# Patient Record
Sex: Female | Born: 1960 | Race: Black or African American | Hispanic: No | Marital: Single | Smoking: Current every day smoker
Health system: Southern US, Community
[De-identification: ages and names within clinical notes are randomized; demographics above are authoritative.]

## PROBLEM LIST (undated history)

## (undated) DIAGNOSIS — F209 Schizophrenia, unspecified: Secondary | ICD-10-CM

## (undated) DIAGNOSIS — R7303 Prediabetes: Secondary | ICD-10-CM

## (undated) HISTORY — PX: OTHER SURGICAL HISTORY: SHX169

---

## 2018-07-01 ENCOUNTER — Other Ambulatory Visit: Payer: Self-pay

## 2018-07-01 ENCOUNTER — Emergency Department
Admission: EM | Admit: 2018-07-01 | Discharge: 2018-07-02 | Disposition: A | Discharge status: Other Acute Inpatient Hospital | Payer: Medicare Other | Attending: Emergency Medicine | Admitting: Emergency Medicine

## 2018-07-01 DIAGNOSIS — F1410 Cocaine abuse, uncomplicated: Secondary | ICD-10-CM

## 2018-07-01 DIAGNOSIS — R443 Hallucinations, unspecified: Secondary | ICD-10-CM

## 2018-07-01 DIAGNOSIS — Z046 Encounter for general psychiatric examination, requested by authority: Secondary | ICD-10-CM | POA: Insufficient documentation

## 2018-07-01 DIAGNOSIS — F259 Schizoaffective disorder, unspecified: Secondary | ICD-10-CM

## 2018-07-01 DIAGNOSIS — Z9114 Patient's other noncompliance with medication regimen: Secondary | ICD-10-CM | POA: Insufficient documentation

## 2018-07-01 DIAGNOSIS — F17200 Nicotine dependence, unspecified, uncomplicated: Secondary | ICD-10-CM | POA: Insufficient documentation

## 2018-07-01 DIAGNOSIS — R441 Visual hallucinations: Secondary | ICD-10-CM | POA: Insufficient documentation

## 2018-07-01 DIAGNOSIS — F329 Major depressive disorder, single episode, unspecified: Secondary | ICD-10-CM | POA: Insufficient documentation

## 2018-07-01 DIAGNOSIS — R440 Auditory hallucinations: Secondary | ICD-10-CM | POA: Insufficient documentation

## 2018-07-01 HISTORY — DX: Prediabetes: R73.03

## 2018-07-01 LAB — COMPREHENSIVE METABOLIC PANEL
ALT: 14 U/L (ref 0–44)
AST: 22 U/L (ref 15–41)
Albumin: 3.8 g/dL (ref 3.5–5.0)
Alkaline Phosphatase: 82 U/L (ref 38–126)
Anion gap: 11 (ref 5–15)
BUN: 13 mg/dL (ref 6–20)
CALCIUM: 8.9 mg/dL (ref 8.9–10.3)
CHLORIDE: 105 mmol/L (ref 98–111)
CO2: 23 mmol/L (ref 22–32)
CREATININE: 1.01 mg/dL — AB (ref 0.44–1.00)
GFR calc Af Amer: >60 mL/min (ref >60)
GFR calc non Af Amer: >60 mL/min (ref >60)
Glucose, Bld: 113 mg/dL — ABNORMAL HIGH (ref 70–99)
Potassium: 4.1 mmol/L (ref 3.5–5.1)
Sodium: 139 mmol/L (ref 135–145)
Total Bilirubin: 0.4 mg/dL (ref 0.3–1.2)
Total Protein: 7.1 g/dL (ref 6.5–8.1)

## 2018-07-01 LAB — ETHANOL: Alcohol, Ethyl (B): <10 mg/dL (ref <10)

## 2018-07-01 LAB — CBC
HCT: 43.3 % (ref 36.0–46.0)
Hemoglobin: 13.8 g/dL (ref 12.0–15.0)
MCH: 27 pg (ref 26.0–34.0)
MCHC: 31.9 g/dL (ref 30.0–36.0)
MCV: 84.6 fL (ref 80.0–100.0)
NRBC: 0 % (ref 0.0–0.2)
PLATELETS: 222 10*3/uL (ref 150–400)
RBC: 5.12 MIL/uL — ABNORMAL HIGH (ref 3.87–5.11)
RDW: 14.4 % (ref 11.5–15.5)
WBC: 11.4 10*3/uL — ABNORMAL HIGH (ref 4.0–10.5)

## 2018-07-01 LAB — ACETAMINOPHEN LEVEL: Acetaminophen (Tylenol), Serum: <10 ug/mL — ABNORMAL LOW (ref 10–30)

## 2018-07-01 LAB — SALICYLATE LEVEL

## 2018-07-01 NOTE — ED Notes (Signed)
Pt. Transferred to BHU from ED to room 3 after screening for contraband. Report to include Situation, Background, Assessment and Recommendations from Vanessa DurhamKim Summers RN. Pt. Oriented to unit including Q15 minute rounds as well as the security cameras for their protection. Patient is alert and oriented, warm and dry in no acute distress. Patient denies SI, HI. Patient states she hears voices without command and see "lots of stuff". Pt. Encouraged to let me know if needs arise.

## 2018-07-01 NOTE — ED Notes (Signed)
Patient alert and oriented. Patient states she is hearing voices and seeing hallucinations. Pt states she has not been taking her medications for a few days and has did some cocaine and drank beer and made the hallucination worse. Patient contracts for safety with this Clinical research associatewriter.

## 2018-07-01 NOTE — ED Triage Notes (Signed)
Pt to the er because she is hearing voices and she is scared and worried. Pt says she took a bus from [place]. Pt states the voices are telling her to kill herself

## 2018-07-01 NOTE — ED Notes (Signed)
Hourly rounding reveals patient sleeping in room. No complaints, stable, in no acute distress. Q15 minute rounds and monitoring via Security Cameras to continue. 

## 2018-07-01 NOTE — ED Provider Notes (Signed)
[facility] EMERGENCY DEPARTMENT Provider Note   CSN: [id number] Arrival date & time: 07/01/18  1927     History   Chief Complaint No chief complaint on file.   HPI Nicole Cuevas is a 57 y.o. female history of schizophrenia, diabetes, chronic pain here presenting with hallucinations.  Patient states that she has worsening hallucination for the last several days.  Patient states that she has auditory and visual hallucinations.  She sees a knife cutting her hair.  She also hears voices and telling her to kill herself.  As a result she left the room where she is from and she did drink some cocaine and beer and the hallucinations became worse.  Patient states that she did not want to take her medicines for the last 2 days including her metformin psych meds.  [facility].   The history is provided by the patient.    Past Medical History:  Diagnosis Date  . Pre-diabetes     There are no active problems to display for this patient.     OB History   None      Home Medications    Prior to Admission medications   Not on File    Family History No family history on file.  Social History Social History   Tobacco Use  . Smoking status: Current Every Day Smoker    Packs/day: 1.00    Years: 40.00    Pack years: 40.00  . Smokeless tobacco: Never Used  Substance Use Topics  . Alcohol use: Yes    Alcohol/week: 1.0 standard drinks    Types: 1 Cans of beer per week  . Drug use: Yes    Types: Cocaine     Allergies   Patient has no allergy information on record.   Review of Systems Review of Systems  Psychiatric/Behavioral: Positive for hallucinations.  All other systems reviewed and are negative.    Physical Exam Updated Vital Signs BP 103/70 (BP Location: Left Arm)   Pulse 93   Temp 98.6 F (37 C) (Oral)   Resp 18   Ht 5\' 3"  (1.6 m)   Wt 103.4 kg   SpO2 96%   BMI 40.39 kg/m   Physical Exam    Constitutional: She is oriented to person, place, and time.  Hallucinating, responding to internal stimuli   HENT:  Head: Normocephalic.  Mouth/Throat: Oropharynx is clear and moist.  Eyes: Pupils are equal, round, and reactive to light. Conjunctivae and EOM are normal.  Neck: Normal range of motion. Neck supple.  Cardiovascular: Normal rate, regular rhythm and normal heart sounds.  Pulmonary/Chest: Effort normal and breath sounds normal. No stridor. No respiratory distress.  Abdominal: Soft. Bowel sounds are normal. She exhibits no distension. There is no tenderness.  Musculoskeletal: Normal range of motion.  Neurological: She is alert and oriented to person, place, and time.  Skin: Skin is warm.  Psychiatric:  Responding to internal stimuli. Depressed   Nursing note and vitals reviewed.    ED Treatments / Results  Labs (all labs ordered are listed, but only abnormal results are displayed) Labs Reviewed  COMPREHENSIVE METABOLIC PANEL - Abnormal; Notable for the following components:      Result Value   Glucose, Bld 113 (*)    Creatinine, Ser 1.01 (*)    All other components within normal limits  ACETAMINOPHEN LEVEL - Abnormal; Notable for the following components:   Acetaminophen (Tylenol), Serum <10 (*)    All  other components within normal limits  CBC - Abnormal; Notable for the following components:   WBC 11.4 (*)    RBC 5.12 (*)    All other components within normal limits  ETHANOL  SALICYLATE LEVEL  URINE DRUG SCREEN, QUALITATIVE ([facility] ONLY)  POC URINE PREG, ED    EKG None  Radiology No results found.  Procedures Procedures (including critical care time)  Medications Ordered in ED Medications - No data to display   Initial Impression / Assessment and Plan / ED Course  I have reviewed the triage vital signs and the nursing notes.  Pertinent labs & imaging results that were available during my care of the patient were reviewed by me and considered in my  medical decision making (see chart for details).    Nicole Cuevas is a 57 y.o. female here with depression, visual and auditory hallucination. She drinks alcohol and uses cocaine. Will get psych clearance labs, consult TTS.   11:25 PM Labs unremarkable. Medically cleared for psych consult.   Final Clinical Impressions(s) / ED Diagnoses   Final diagnoses:  None    ED Discharge Orders    None       Charlynne Pander, MD 07/01/18 2325

## 2018-07-02 ENCOUNTER — Inpatient Hospital Stay
Admission: AD | Admit: 2018-07-02 | Discharge: 2018-07-11 | DRG: 885 | Disposition: A | Discharge status: Home / Self Care | Payer: Medicare Other | Source: Intra-hospital | Attending: Psychiatry | Admitting: Psychiatry

## 2018-07-02 ENCOUNTER — Other Ambulatory Visit: Payer: Self-pay

## 2018-07-02 ENCOUNTER — Encounter: Payer: Self-pay | Admitting: Psychiatry

## 2018-07-02 DIAGNOSIS — Z9114 Patient's other noncompliance with medication regimen: Secondary | ICD-10-CM

## 2018-07-02 DIAGNOSIS — Z7984 Long term (current) use of oral hypoglycemic drugs: Secondary | ICD-10-CM

## 2018-07-02 DIAGNOSIS — F1410 Cocaine abuse, uncomplicated: Secondary | ICD-10-CM | POA: Diagnosis present

## 2018-07-02 DIAGNOSIS — F23 Brief psychotic disorder: Secondary | ICD-10-CM | POA: Diagnosis present

## 2018-07-02 DIAGNOSIS — F17210 Nicotine dependence, cigarettes, uncomplicated: Secondary | ICD-10-CM | POA: Diagnosis present

## 2018-07-02 DIAGNOSIS — F29 Unspecified psychosis not due to a substance or known physiological condition: Secondary | ICD-10-CM | POA: Diagnosis present

## 2018-07-02 DIAGNOSIS — Z79899 Other long term (current) drug therapy: Secondary | ICD-10-CM | POA: Diagnosis not present

## 2018-07-02 DIAGNOSIS — F200 Paranoid schizophrenia: Secondary | ICD-10-CM | POA: Diagnosis present

## 2018-07-02 DIAGNOSIS — F209 Schizophrenia, unspecified: Secondary | ICD-10-CM

## 2018-07-02 DIAGNOSIS — G2401 Drug induced subacute dyskinesia: Secondary | ICD-10-CM | POA: Diagnosis present

## 2018-07-02 DIAGNOSIS — K219 Gastro-esophageal reflux disease without esophagitis: Secondary | ICD-10-CM | POA: Diagnosis present

## 2018-07-02 DIAGNOSIS — E119 Type 2 diabetes mellitus without complications: Secondary | ICD-10-CM | POA: Diagnosis present

## 2018-07-02 DIAGNOSIS — F203 Undifferentiated schizophrenia: Secondary | ICD-10-CM | POA: Diagnosis not present

## 2018-07-02 DIAGNOSIS — F259 Schizoaffective disorder, unspecified: Secondary | ICD-10-CM

## 2018-07-02 DIAGNOSIS — F251 Schizoaffective disorder, depressive type: Secondary | ICD-10-CM | POA: Diagnosis not present

## 2018-07-02 LAB — GLUCOSE, CAPILLARY: GLUCOSE-CAPILLARY: 174 mg/dL — AB (ref 70–99)

## 2018-07-02 MED ORDER — OLANZAPINE 10 MG PO TABS: 10.0000 mg | ORAL_TABLET | Freq: Every day | ORAL | Status: DC

## 2018-07-02 MED ORDER — ACETAMINOPHEN 325 MG PO TABS
650.0000 mg | ORAL_TABLET | Freq: Four times a day (QID) | ORAL | Status: DC | PRN
  Administered 2018-07-06: 650 mg via ORAL
  Filled 2018-07-02: qty 2

## 2018-07-02 MED ORDER — BENZTROPINE MESYLATE 1 MG PO TABS: 1.0000 mg | ORAL_TABLET | Freq: Two times a day (BID) | ORAL | Status: DC | PRN

## 2018-07-02 MED ORDER — MAGNESIUM HYDROXIDE 400 MG/5ML PO SUSP: 30.0000 mL | Freq: Every day | ORAL | Status: DC | PRN

## 2018-07-02 MED ORDER — ESCITALOPRAM OXALATE 10 MG PO TABS
20.0000 mg | ORAL_TABLET | Freq: Every day | ORAL | Status: DC
  Administered 2018-07-02: 20 mg via ORAL
  Filled 2018-07-02: qty 2

## 2018-07-02 MED ORDER — TRAZODONE HCL 100 MG PO TABS: 100.0000 mg | ORAL_TABLET | Freq: Every evening | ORAL | Status: DC | PRN

## 2018-07-02 MED ORDER — ESCITALOPRAM OXALATE 10 MG PO TABS
20.0000 mg | ORAL_TABLET | Freq: Every day | ORAL | Status: DC
  Administered 2018-07-03 – 2018-07-11 (×9): 20 mg via ORAL
  Filled 2018-07-02 (×10): qty 2

## 2018-07-02 MED ORDER — OLANZAPINE 10 MG PO TABS
10.0000 mg | ORAL_TABLET | Freq: Every day | ORAL | Status: DC
  Administered 2018-07-02: 10 mg via ORAL
  Filled 2018-07-02: qty 1

## 2018-07-02 MED ORDER — HYDROXYZINE HCL 25 MG PO TABS
25.0000 mg | ORAL_TABLET | Freq: Three times a day (TID) | ORAL | Status: DC | PRN
  Administered 2018-07-03 – 2018-07-09 (×3): 25 mg via ORAL
  Filled 2018-07-02 (×3): qty 1

## 2018-07-02 MED ORDER — ALUM & MAG HYDROXIDE-SIMETH 200-200-20 MG/5ML PO SUSP
30.0000 mL | ORAL | Status: DC | PRN
  Administered 2018-07-03 – 2018-07-09 (×9): 30 mL via ORAL
  Filled 2018-07-02 (×10): qty 30

## 2018-07-02 NOTE — Consult Note (Signed)
[place] Psychiatry Consult   Reason for Consult: Consult for this 57 year old woman with chronic mental health problems who presents to the emergency room reporting psychotic symptoms Referring Physician: McShane Patient Identification: Nicole Cuevas MRN:  [id number] Principal Diagnosis: Schizoaffective disorder [facility]) Diagnosis:   Patient Active Problem List   Diagnosis Date Noted  . Schizoaffective disorder ([place]) [F25.9] 07/02/2018  . Cocaine abuse ([place]) [F14.10] 07/02/2018    Total Time spent with patient: 1 hour  Subjective:   Nicole Cuevas is a 57 y.o. female patient admitted with "I have been seeing stabbing and spiders".  HPI: Patient interviewed chart reviewed.  Patient came voluntarily to the emergency room saying that her hallucinations and paranoia have been worse.  She says that she sees stabbing sees people being hurt sees spiders around her has a variety of visual hallucinations.  Mood feels anxious and afraid and paranoid.  Not sleeping well not eating well.  She says overall she is been feeling bad for several months and it is gradually getting worse.  The patient lives in [place] but is here in [place] because she got so paranoid that she jumped on a bus aiming to go to [place] for some reason and then came back here to [place].  Patient admits to having had a beer yesterday and some cocaine yesterday but claims that she uses both of those very rarely.  She says she had been compliant with her medicine probably up until the last day.  Denies any intent to hurt her self or hurt anyone else.  Social history: Patient lives with her sister in [place] and has regular outpatient care there  Medical history: Prediabetes listed although her sugars are not very bad here and the patient is not on any other medicine besides the psychiatric  Substance abuse history: Nothing really to speak of in the old history and the patient says that her use of drugs is very  infrequent.  Past Psychiatric History: History of schizoaffective disorder.  Has had hospitalizations in the past although she says it has been several years since then.  Denies ever having tried to kill her self in the past.  Current medications appear to be olanzapine Lexapro and trazodone  Risk to Self: Suicidal Ideation: No Suicidal Intent: No Is patient at risk for suicide?: No Suicidal Plan?: No Access to Means: No What has been your use of drugs/alcohol within the last 12 months?: current user How many times?: 0 Other Self Harm Risks: n/a Triggers for Past Attempts: None known Intentional Self Injurious Behavior: None Risk to Others: Homicidal Ideation: No Thoughts of Harm to Others: No Current Homicidal Intent: No Current Homicidal Plan: No Access to Homicidal Means: No History of harm to others?: No Assessment of Violence: None Noted Does patient have access to weapons?: No Does patient have a court date: No Prior Inpatient Therapy: Prior Inpatient Therapy: Yes Prior Therapy Dates: several Prior Therapy Facilty/Provider(s): several Reason for Treatment: A/V H Prior Outpatient Therapy: Prior Outpatient Therapy: No Does patient have an ACCT team?: No Does patient have Intensive In-House Services?  : No Does patient have [facility] services? : No Does patient have P4CC services?: No  Past Medical History:  Past Medical History:  Diagnosis Date  . Pre-diabetes     Family History: No family history on file. Family Psychiatric  History: Patient suspects she may have some mental health issues in the family but is not entirely sure Social History:  Social History   Substance and Sexual Activity  Alcohol Use Yes  . Alcohol/week: 1.0 standard drinks  . Types: 1 Cans of beer per week     Social History   Substance and Sexual Activity  Drug Use Yes  . Types: Cocaine    Social History   Socioeconomic History  . Marital status: Single    Spouse name: Not on file  .  Number of children: Not on file  . Years of education: Not on file  . Highest education level: Not on file  Occupational History  . Not on file  Social Needs  . Financial resource strain: Not on file  . Food insecurity:    Worry: Not on file    Inability: Not on file  . Transportation needs:    Medical: Not on file    Non-medical: Not on file  Tobacco Use  . Smoking status: Current Every Day Smoker    Packs/day: 1.00    Years: 40.00    Pack years: 40.00  . Smokeless tobacco: Never Used  Substance and Sexual Activity  . Alcohol use: Yes    Alcohol/week: 1.0 standard drinks    Types: 1 Cans of beer per week  . Drug use: Yes    Types: Cocaine  . Sexual activity: Not on file  Lifestyle  . Physical activity:    Days per week: Not on file    Minutes per session: Not on file  . Stress: Not on file  Relationships  . Social connections:    Talks on phone: Not on file    Gets together: Not on file    Attends religious service: Not on file    Active member of club or organization: Not on file    Attends meetings of clubs or organizations: Not on file    Relationship status: Not on file  Other Topics Concern  . Not on file  Social History Narrative  . Not on file   Additional Social History:    Allergies:  Not on File  Labs:  Results for orders placed or performed during the hospital encounter of 07/01/18 (from the past 48 hour(s))  Comprehensive metabolic panel     Status: Abnormal   Collection Time: 07/01/18  7:56 PM  Result Value Ref Range   Sodium 139 135 - 145 mmol/L   Potassium 4.1 3.5 - 5.1 mmol/L   Chloride 105 98 - 111 mmol/L   CO2 23 22 - 32 mmol/L   Glucose, Bld 113 (H) 70 - 99 mg/dL   BUN 13 6 - 20 mg/dL   Creatinine, Ser 1.01 (H) 0.44 - 1.00 mg/dL   Calcium 8.9 8.9 - 10.3 mg/dL   Total Protein 7.1 6.5 - 8.1 g/dL   Albumin 3.8 3.5 - 5.0 g/dL   AST 22 15 - 41 U/L   ALT 14 0 - 44 U/L   Alkaline Phosphatase 82 38 - 126 U/L   Total Bilirubin 0.4 0.3 -  1.2 mg/dL   GFR calc non Af Amer >60 >60 mL/min   GFR calc Af Amer >60 >60 mL/min    Comment: (NOTE) The eGFR has been calculated using the CKD EPI equation. This calculation has not been validated in all clinical situations. eGFR's persistently <60 mL/min signify possible Chronic Kidney Disease.    Anion gap 11 5 - 15    Comment: Performed at [facility], [address] [place], [place] [address]  Ethanol     Status: None   Collection Time: 07/01/18  7:56 PM  Result Value Ref Range   Alcohol, Ethyl (B) <10 <10 mg/dL    Comment: (NOTE) Lowest detectable limit for serum alcohol is 10 mg/dL. For medical purposes only. Performed at [facility], [address] [place], [place] [address]   Salicylate level     Status: None   Collection Time: 07/01/18  7:56 PM  Result Value Ref Range   Salicylate Lvl <4.5 2.8 - 30.0 mg/dL    Comment: Performed at [facility], [address] [place], [place] [address]  Acetaminophen level     Status: Abnormal   Collection Time: 07/01/18  7:56 PM  Result Value Ref Range   Acetaminophen (Tylenol), Serum <10 (L) 10 - 30 ug/mL    Comment: (NOTE) Therapeutic concentrations vary significantly. A range of 10-30 ug/mL  may be an effective concentration for many patients. However, some  are best treated at concentrations outside of this range. Acetaminophen concentrations >150 ug/mL at 4 hours after ingestion  and >50 ug/mL at 12 hours after ingestion are often associated with  toxic reactions. Performed at [facility], [address] [place], [place] [address]   cbc     Status: Abnormal   Collection Time: 07/01/18  7:56 PM  Result Value Ref Range   WBC 11.4 (H) 4.0 - 10.5 K/uL   RBC 5.12 (H) 3.87 - 5.11 MIL/uL   Hemoglobin 13.8 12.0 - 15.0 g/dL   HCT 43.3 36.0 - 46.0 %   MCV 84.6 80.0 - 100.0 fL   MCH 27.0 26.0 - 34.0 pg   MCHC 31.9 30.0 - 36.0 g/dL   RDW 14.4 11.5 - 15.5 %   Platelets 222 150 - 400  K/uL   nRBC 0.0 0.0 - 0.2 %    Comment: Performed at [facility], [address] [place], [place] [address]    Current Facility-Administered Medications  Medication Dose Route Frequency Provider Last Rate Last Dose  . benztropine (COGENTIN) tablet 1 mg  1 mg Oral BID PRN Jolyne Laye T, MD      . escitalopram (LEXAPRO) tablet 20 mg  20 mg Oral Daily Ryland Smoots T, MD   20 mg at 07/02/18 1256  . OLANZapine (ZYPREXA) tablet 10 mg  10 mg Oral QHS Gustavia Carie, Madie Reno, MD       Current Outpatient Medications  Medication Sig Dispense Refill  . benztropine (COGENTIN) 1 MG tablet Take 1 tablet by mouth at bedtime.    Marland Kitchen escitalopram (LEXAPRO) 20 MG tablet Take 1 tablet by mouth daily.    . hydrOXYzine (VISTARIL) 25 MG capsule Take 25 mg by mouth 2 (two) times daily as needed.    Marland Kitchen OLANZapine (ZYPREXA) 10 MG tablet Take 1 tablet by mouth at bedtime.       Musculoskeletal: Strength & Muscle Tone: within normal limits Gait & Station: normal Patient leans: N/A  Psychiatric Specialty Exam: Physical Exam  Nursing note and vitals reviewed. Constitutional: She appears well-developed and well-nourished.  HENT:  Head: Normocephalic and atraumatic.  Eyes: Pupils are equal, round, and reactive to light. Conjunctivae are normal.  Neck: Normal range of motion.  Cardiovascular: Regular rhythm and normal heart sounds.  Respiratory: Effort normal. No respiratory distress.  GI: Soft.  Musculoskeletal: Normal range of motion.  Neurological: She is alert.  Skin: Skin is warm and dry.  Psychiatric: Her affect is blunt. Her speech is delayed. She is slowed. Thought content is paranoid and delusional. She expresses impulsivity. She expresses no homicidal and no  suicidal ideation. She exhibits abnormal recent memory.    Review of Systems  Constitutional: Negative.   HENT: Negative.   Eyes: Negative.   Respiratory: Negative.   Cardiovascular: Negative.   Gastrointestinal: Negative.    Musculoskeletal: Negative.   Skin: Negative.   Neurological: Negative.   Psychiatric/Behavioral: Positive for depression, hallucinations, memory loss and substance abuse. Negative for suicidal ideas. The patient is nervous/anxious and has insomnia.     Blood pressure 114/69, pulse 66, temperature 98 F (36.7 C), temperature source Oral, resp. rate 18, height 5' 3"  (1.6 m), weight 103.4 kg, SpO2 97 %.Body mass index is 40.39 kg/m.  General Appearance: Disheveled  Eye Contact:  Minimal  Speech:  Garbled and Slow  Volume:  Decreased  Mood:  Dysphoric  Affect:  Constricted  Thought Process:  Disorganized  Orientation:  Negative  Thought Content:  Illogical, Delusions, Hallucinations: Visual and Paranoid Ideation  Suicidal Thoughts:  No  Homicidal Thoughts:  No  Memory:  Immediate;   Fair Recent;   Fair Remote;   Fair  Judgement:  Fair  Insight:  Fair  Psychomotor Activity:  Decreased  Concentration:  Concentration: Fair  Recall:  [employer] of Knowledge:  Fair  Language:  Fair  Akathisia:  No  Handed:  Right  AIMS (if indicated):     Assets:  Desire for Improvement Housing Physical Health Resilience  ADL's:  Intact  Cognition:  Impaired,  Mild  Sleep:        Treatment Plan Summary: Daily contact with patient to assess and evaluate symptoms and progress in treatment, Medication management and Plan Patient with schizophrenia or schizoaffective disorder reporting active hallucinations and delusions.  Feeling frightened.  Disorganized behavior poor self-care.  Patient is appropriate for admission to the psychiatric ward for stabilization.  She is agreeable to the plan.  Labs reviewed case reviewed with emergency room doctor and TTS.  Restart medicines based on the most recent medication reconciliation I can find.  Orders done for admission downstairs.  Disposition: Recommend psychiatric Inpatient admission when medically cleared. Supportive therapy provided about ongoing  stressors.  Alethia Berthold, MD 07/02/2018 4:27 PM

## 2018-07-02 NOTE — ED Notes (Signed)
Hourly rounding reveals patient sleeping in room. No complaints, stable, in no acute distress. Q15 minute rounds and monitoring via Security Cameras to continue. 

## 2018-07-02 NOTE — ED Notes (Signed)
Hourly rounding reveals patient in room. No complaints, stable, in no acute distress. Q15 minute rounds and monitoring via Security Cameras to continue. 

## 2018-07-02 NOTE — BH Assessment (Signed)
Patient is to be admitted to [facility] BMU by Dr. Toni Amendlapacs.  Attending Physician will be Dr. Johnella MoloneyMcNew.   Patient has been assigned to room 322, by [facility] Charge Nurse Randa EvensJoanne.   ER staff is aware of the admission:  Emilie, ER Secretary    Dr. Pershing ProudSchaevitz, ER MD   Prudencio Burlyhea, Patient's Nurse   Donella StadeAnthony J., Patient Access.

## 2018-07-02 NOTE — ED Notes (Signed)
Report to include Situation, Background, Assessment, and Recommendations received from RN Rhea. Patient alert and oriented, warm and dry, in no acute distress. Patient denies SI, HI, and pain. She reported AVH. Patient made aware of Q15 minute rounds and security cameras for their safety. Patient instructed to come to me with needs or concerns.

## 2018-07-02 NOTE — BH Assessment (Signed)
Assessment Note  Nicole Cuevas is an 57 y.o. female presents to the ED with c/o hearing voices and having visual hallucinations. Pt reports that she currently lives at home with her sister and seeks therapy with [facility] in [place] [phone]. Pt reports "I was seeing knives and throats being cut. I keep seeing spiders and dead family members and I came here cause it just keeps getting worse. Pt reports having sx of depression. Pt reports smoking crack cocaine and drinking alcohol earlier in the day before coming to the hospital. Pt reports several inpatient admissions into behavioral health hospitals over the years.   During the assessment, the pt was calm and cooperative. Her words were muffled and she has a slight slur making it difficult to understand what she is saying. Pt kept her face partially covered with her back turned towards this writer and no intentional eye contact. Pt reports that she hasn't slept in 4 days and that her body is physically drained.  Pt denies SI/HI but endorses A/V H.    Diagnosis: Schizoaffective disorder   Past Medical History:  Past Medical History:  Diagnosis Date  . Pre-diabetes    Family History: No family history on file.  Social History:  reports that she has been smoking. She has a 40.00 pack-year smoking history. She has never used smokeless tobacco. She reports that she drinks about 1.0 standard drinks of alcohol per week. She reports that she has current or past drug history. Drug: Cocaine.  Additional Social History:  Alcohol / Drug Use Pain Medications: SEE MAR Prescriptions: SEE MAR Over the Counter: SEE MAR History of alcohol / drug use?: Yes Negative Consequences of Use: Financial, Personal relationships Substance #1 Name of Substance 1: Crack Cocaine 1 - Last Use / Amount: 07/01/2018 Substance #2 Name of Substance 2: Alcohol 2 - Last Use / Amount: 07/01/2018  CIWA: CIWA-Ar BP: 103/70 Pulse Rate: 93 COWS:    Allergies: Not on  File  Home Medications:  (Not in a hospital admission)  OB/GYN Status:  No LMP recorded. Patient is postmenopausal.  General Assessment Data Location of Assessment: [facility] ED TTS Assessment: In system Is this a Tele or Face-to-Face Assessment?: Face-to-Face Is this an Initial Assessment or a Re-assessment for this encounter?: Initial Assessment Patient Accompanied by:: N/A Language Other than English: No Living Arrangements: Other (Comment) What gender do you identify as?: Female Marital status: Single Pregnancy Status: No Living Arrangements: Other relatives Admission Status: Voluntary Is patient capable of signing voluntary admission?: Yes Referral Source: Self/Family/Friend Insurance type: none  Medical Screening Exam [facility] Walk-in ONLY) Medical Exam completed: Yes  Crisis Care Plan Living Arrangements: Other relatives  Education Status Is patient currently in school?: No Is the patient employed, unemployed or receiving disability?: Unemployed  Risk to self with the past 6 months Suicidal Ideation: No Has patient been a risk to self within the past 6 months prior to admission? : No Suicidal Intent: No Has patient had any suicidal intent within the past 6 months prior to admission? : No Is patient at risk for suicide?: No Suicidal Plan?: No Has patient had any suicidal plan within the past 6 months prior to admission? : No Access to Means: No What has been your use of drugs/alcohol within the last 12 months?: current user Previous Attempts/Gestures: No How many times?: 0 Other Self Harm Risks: n/a Triggers for Past Attempts: None known Intentional Self Injurious Behavior: None Family Suicide History: No Recent stressful life event(s): Turmoil (Comment) Persecutory  voices/beliefs?: Yes Depression: Yes Depression Symptoms: Fatigue, Guilt, Loss of interest in usual pleasures, Feeling worthless/self pity, Feeling angry/irritable Substance abuse history and/or  treatment for substance abuse?: Yes Suicide prevention information given to non-admitted patients: Not applicable  Risk to Others within the past 6 months Homicidal Ideation: No Does patient have any lifetime risk of violence toward others beyond the six months prior to admission? : No Thoughts of Harm to Others: No Current Homicidal Intent: No Current Homicidal Plan: No Access to Homicidal Means: No History of harm to others?: No Assessment of Violence: None Noted Does patient have access to weapons?: No Does patient have a court date: No Is patient on probation?: No  Psychosis Hallucinations: Auditory, Visual Delusions: None noted  Mental Status Report Appearance/Hygiene: In scrubs Eye Contact: Poor Motor Activity: Freedom of movement Speech: Logical/coherent Level of Consciousness: Alert Mood: Depressed Affect: Appropriate to circumstance Anxiety Level: Minimal Thought Processes: Coherent, Relevant Judgement: Partial Orientation: Person, Place, Appropriate for developmental age, Time, Situation Obsessive Compulsive Thoughts/Behaviors: Minimal  Cognitive Functioning Concentration: Normal Memory: Recent Impaired, Remote Impaired Is patient IDD: No Insight: Fair Impulse Control: Poor Appetite: Good Have you had any weight changes? : No Change Sleep: No Change Total Hours of Sleep: 6 Vegetative Symptoms: None  ADLScreening [facility] Assessment Services) Patient's cognitive ability adequate to safely complete daily activities?: Yes Patient able to express need for assistance with ADLs?: Yes Independently performs ADLs?: Yes (appropriate for developmental age)  Prior Inpatient Therapy Prior Inpatient Therapy: Yes Prior Therapy Dates: several Prior Therapy Facilty/Provider(s): several Reason for Treatment: A/V H  Prior Outpatient Therapy Prior Outpatient Therapy: No Does patient have an ACCT team?: No Does patient have Intensive In-House Services?  : No Does  patient have [facility] services? : No Does patient have P4CC services?: No  ADL Screening (condition at time of admission) Patient's cognitive ability adequate to safely complete daily activities?: Yes Is the patient deaf or have difficulty hearing?: No Does the patient have difficulty seeing, even when wearing glasses/contacts?: No Does the patient have difficulty concentrating, remembering, or making decisions?: No Patient able to express need for assistance with ADLs?: Yes Does the patient have difficulty dressing or bathing?: No Independently performs ADLs?: Yes (appropriate for developmental age) Does the patient have difficulty walking or climbing stairs?: No Weakness of Legs: None Weakness of Arms/Hands: None  Home Assistive Devices/Equipment Home Assistive Devices/Equipment: None  Therapy Consults (therapy consults require a physician order) PT Evaluation Needed: No OT Evalulation Needed: No SLP Evaluation Needed: No Abuse/Neglect Assessment (Assessment to be complete while patient is alone) Abuse/Neglect Assessment Can Be Completed: Yes Physical Abuse: Denies Verbal Abuse: Denies Sexual Abuse: Denies Exploitation of patient/patient's resources: Denies Self-Neglect: Denies Values / Beliefs Cultural Requests During Hospitalization: None Spiritual Requests During Hospitalization: None Consults Spiritual Care Consult Needed: No Social Work Consult Needed: No Merchant navy officerAdvance Directives (For Healthcare) Does Patient Have a Medical Advance Directive?: No Would patient like information on creating a medical advance directive?: No - Patient declined          Disposition:  Disposition Initial Assessment Completed for this Encounter: Yes Disposition of Patient: (Pending) Patient refused recommended treatment: No Mode of transportation if patient is discharged?: Car Patient referred to: (Pending)  On Site Evaluation by:   Reviewed with Physician:    Teagan Heidrick D  [facility] Teegarden 07/02/2018 5:05 AM

## 2018-07-03 LAB — URINALYSIS, COMPLETE (UACMP) WITH MICROSCOPIC
BILIRUBIN URINE: NEGATIVE
Glucose, UA: NEGATIVE mg/dL
KETONES UR: NEGATIVE mg/dL
Leukocytes, UA: NEGATIVE
NITRITE: NEGATIVE
Protein, ur: NEGATIVE mg/dL
Specific Gravity, Urine: 1.011 (ref 1.005–1.030)
WBC UA: NONE SEEN WBC/hpf (ref 0–5)
pH: 8 (ref 5.0–8.0)

## 2018-07-03 LAB — LIPID PANEL
CHOL/HDL RATIO: 2.8 ratio
CHOLESTEROL: 189 mg/dL (ref 0–200)
HDL: 68 mg/dL (ref >40)
LDL CALC: 97 mg/dL (ref 0–99)
Triglycerides: 121 mg/dL (ref <150)
VLDL: 24 mg/dL (ref 0–40)

## 2018-07-03 LAB — TSH: TSH: 0.655 u[IU]/mL (ref 0.350–4.500)

## 2018-07-03 LAB — HEMOGLOBIN A1C
Hgb A1c MFr Bld: 6.3 % — ABNORMAL HIGH (ref 4.8–5.6)
Mean Plasma Glucose: 134.11 mg/dL

## 2018-07-03 MED ORDER — TRAZODONE HCL 50 MG PO TABS
50.0000 mg | ORAL_TABLET | Freq: Every evening | ORAL | Status: DC | PRN
  Administered 2018-07-03 – 2018-07-09 (×4): 50 mg via ORAL
  Filled 2018-07-03 (×4): qty 1

## 2018-07-03 MED ORDER — NICOTINE 14 MG/24HR TD PT24
14.0000 mg | MEDICATED_PATCH | Freq: Every day | TRANSDERMAL | Status: DC
  Administered 2018-07-03 – 2018-07-11 (×9): 14 mg via TRANSDERMAL
  Filled 2018-07-03 (×11): qty 1

## 2018-07-03 MED ORDER — METFORMIN HCL 500 MG PO TABS
500.0000 mg | ORAL_TABLET | Freq: Every day | ORAL | Status: DC
  Administered 2018-07-04 – 2018-07-05 (×2): 500 mg via ORAL
  Filled 2018-07-03 (×2): qty 1

## 2018-07-03 MED ORDER — OLANZAPINE 5 MG PO TABS
15.0000 mg | ORAL_TABLET | Freq: Every day | ORAL | Status: DC
  Administered 2018-07-03 – 2018-07-04 (×2): 15 mg via ORAL
  Filled 2018-07-03 (×2): qty 3

## 2018-07-03 NOTE — Progress Notes (Signed)
Recreation Therapy Notes  INPATIENT RECREATION THERAPY ASSESSMENT  Patient Details Name: Nicole MimesDarlene Cuevas MRN: [id number] DOB: 1960/11/17 Today's Date: 07/03/2018       Information Obtained From: Patient  Able to Participate in Assessment/Interview: Yes  Patient Presentation: Responsive  Reason for Admission (Per Patient): Active Symptoms, Other (Comments)(Hearing voices and seeing things)  Patient Stressors:    Coping Skills:   Substance Abuse, Other (Comment)(Drink beer, ridding the bus, sit on the park bench)  Leisure Interests (2+):  Sports - Basketball, [employer] - Tax inspectorhopping mall  Frequency of Recreation/Participation: Chief Executive OfficerMonthly  Awareness of Community Resources:  Yes  Community Resources:  [place], [place]  Current Use:    If no, Barriers?:    Expressed Interest in [address] [employer] CorporationCommunity Resource Information:    [place]  [place]  Patient Main Form of Transportation: Therapist, musicublic Transportation  Patient Strengths:  Nice to others  Patient Identified Areas of Improvement:  Saving to get an apartment  Patient Goal for Hospitalization:  To get into a boarding house  Current SI (including self-harm):  No  Current HI:  No  Current AVH: Yes  Staff Intervention Plan: Group Attendance, Collaborate with Interdisciplinary Treatment Team  Consent to Intern Participation: N/A  Nicole Cuevas 07/03/2018, 2:00 PM

## 2018-07-03 NOTE — Tx Team (Addendum)
Interdisciplinary Treatment and Diagnostic Plan Update  07/03/2018 Time of Session: 10:30am Nicole Cuevas MRN: [id number]  Principal Diagnosis: Schizophrenia [facility])  Secondary Diagnoses: Principal Problem:   Schizophrenia (HCC)   Current Medications:  Current Facility-Administered Medications  Medication Dose Route Frequency Provider Last Rate Last Dose  . acetaminophen (TYLENOL) tablet 650 mg  650 mg Oral Q6H PRN Nicole Cuevas, Nicole T, MD      . alum & mag hydroxide-simeth (MAALOX/MYLANTA) 200-200-20 MG/5ML suspension 30 mL  30 mL Oral Q4H PRN Nicole Cuevas, Nicole Denmark, MD   30 mL at 07/03/18 0819  . escitalopram (LEXAPRO) tablet 20 mg  20 mg Oral Daily Nicole Cuevas, Nicole Denmark, MD   20 mg at 07/03/18 0815  . hydrOXYzine (ATARAX/VISTARIL) tablet 25 mg  25 mg Oral TID PRN Nicole Cuevas, Nicole Denmark, MD      . magnesium hydroxide (MILK OF MAGNESIA) suspension 30 mL  30 mL Oral Daily PRN Nicole Cuevas, Nicole Denmark, MD      . Melene Muller ON 07/04/2018] metFORMIN (GLUCOPHAGE) tablet 500 mg  500 mg Oral Q breakfast Nicole Cuevas, Nicole R, MD      . nicotine (NICODERM CQ - dosed in mg/24 hours) patch 14 mg  14 mg Transdermal Daily Nicole Cuevas, Nicole Denmark, MD   14 mg at 07/03/18 0815  . OLANZapine (ZYPREXA) tablet 15 mg  15 mg Oral QHS Nicole Cuevas, Nicole R, MD      . traZODone (DESYREL) tablet 50 mg  50 mg Oral QHS PRN Nicole Cuevas, Nicole Hutchinson, MD       PTA Medications: Medications Prior to Admission  Medication Sig Dispense Refill Last Dose  . benztropine (COGENTIN) 1 MG tablet Take 1 tablet by mouth at bedtime.   Unknown at Unknown  . escitalopram (LEXAPRO) 20 MG tablet Take 1 tablet by mouth daily.   Unknown at Unknown  . hydrOXYzine (VISTARIL) 25 MG capsule Take 25 mg by mouth 2 (two) times daily as needed.   prn at prn  . OLANZapine (ZYPREXA) 10 MG tablet Take 1 tablet by mouth at bedtime.    Unknown at Unknown    Patient Stressors: Other: Depression, auditory, and visual hallucinations  Patient Strengths: Advertising account executive for  treatment/growth Physical Health Special hobby/interest Supportive family/friends  Treatment Modalities: Medication Management, Group therapy, Case management,  1 to 1 session with clinician, Psychoeducation, Recreational therapy.   Physician Treatment Plan for Primary Diagnosis: Schizophrenia (HCC) Long Term Goal(s): Improvement in symptoms so as ready for discharge   Short Term Goals: Ability to verbalize feelings will improve  Medication Management: Evaluate patient's response, side effects, and tolerance of medication regimen.  Therapeutic Interventions: 1 to 1 sessions, Unit Group sessions and Medication administration.  Evaluation of Outcomes: Progressing  Physician Treatment Plan for Secondary Diagnosis: Principal Problem:   Schizophrenia (HCC)  Long Term Goal(s): Improvement in symptoms so as ready for discharge   Short Term Goals: Ability to verbalize feelings will improve     Medication Management: Evaluate patient's response, side effects, and tolerance of medication regimen.  Therapeutic Interventions: 1 to 1 sessions, Unit Group sessions and Medication administration.  Evaluation of Outcomes: Progressing   RN Treatment Plan for Primary Diagnosis: Schizophrenia (HCC) Long Term Goal(s): Knowledge of disease and therapeutic regimen to maintain health will improve  Short Term Goals: Ability to demonstrate self-control, Ability to participate in decision making will improve, Ability to verbalize feelings will improve, Ability to identify and develop effective coping behaviors will improve and Compliance with prescribed medications will improve  Medication  Management: RN will administer medications as ordered by provider, will assess and evaluate patient's response and provide education to patient for prescribed medication. RN will report any adverse and/or side effects to prescribing provider.  Therapeutic Interventions: 1 on 1 counseling sessions, Psychoeducation,  Medication administration, Evaluate responses to treatment, Monitor vital signs and CBGs as ordered, Perform/monitor CIWA, COWS, AIMS and Fall Risk screenings as ordered, Perform wound care treatments as ordered.  Evaluation of Outcomes: Progressing   LCSW Treatment Plan for Primary Diagnosis: Schizophrenia (HCC) Long Term Goal(s): Safe transition to appropriate next level of care at discharge, Engage patient in therapeutic group addressing interpersonal concerns.  Short Term Goals: Engage patient in aftercare planning with referrals and resources, Increase social support, Identify triggers associated with mental health/substance abuse issues and Increase skills for wellness and recovery  Therapeutic Interventions: Assess for all discharge needs, 1 to 1 time with Social worker, Explore available resources and support systems, Assess for adequacy in community support network, Educate family and significant other(s) on suicide prevention, Complete Psychosocial Assessment, Interpersonal group therapy.  Evaluation of Outcomes: Progressing   Progress in Treatment: Attending groups: Yes. Participating in groups: Yes. Taking medication as prescribed: Yes. Toleration medication: Yes. Family/Significant other contact made: No, will contact:  Sister Nicole Cuevas [phone] Patient understands diagnosis: Yes. Discussing patient identified problems/goals with staff: Yes. Medical problems stabilized or resolved: Yes. Denies suicidal/homicidal ideation: Yes. Issues/concerns per patient self-inventory: No. Other:   New problem(s) identified: No, Describe:  None  New Short Term/Long Term Goal(s): "To get well."  Patient Goals:   "To get well."  Discharge Plan or Barriers: To discharge home to sister and follow up with outpatient provider.  Reason for Continuation of Hospitalization: Medication stabilization  Estimated Length of Stay: 3-5 days   Recreational Therapy: Patient  Stressors: N/A Patient Goal: Patient will identify 3 positive coping skills strategies to use post d/c within 5 recreation therapy group sessions  Attendees: Patient: Nicole Cuevas 07/03/2018 11:17 AM  Physician: Corinna GabHolly McNew, MD 07/03/2018 11:17 AM  Nursing: Milas HockShatara Powell, RN 07/03/2018 11:17 AM  RN Care Manager: 07/03/2018 11:17 AM  Social Worker: Johny Shearsassandra Jarrett, LCSWA 07/03/2018 11:17 AM  Recreational Therapist:Shay. Dreama SaaOutlaw CTRS, LRT 07/03/2018 11:17 AM  Other: Huey RomansSonya Carter, LCSW 07/03/2018 11:17 AM  Other:  07/03/2018 11:17 AM  Other: 07/03/2018 11:17 AM    Scribe for Treatment Team: Johny Shearsassandra  Jarrett, LCSW 07/03/2018 11:17 AM

## 2018-07-03 NOTE — Tx Team (Signed)
Initial Treatment Plan 07/03/2018 1:10 AM Nicole Mimesarlene Fitting WUJ:[id number]    PATIENT STRESSORS: Other: Depression, auditory, and visual hallucinations   PATIENT STRENGTHS: Advertising account executiveCommunication skills Financial means Motivation for treatment/growth Physical Health Special hobby/interest Supportive family/friends   PATIENT IDENTIFIED PROBLEMS: psychosis  depression                   DISCHARGE CRITERIA:  Improved stabilization in mood, thinking, and/or behavior Need for constant or close observation no longer present Verbal commitment to aftercare and medication compliance  PRELIMINARY DISCHARGE PLAN: Outpatient therapy Return to previous living arrangement Return to previous work or school arrangements  PATIENT/FAMILY INVOLVEMENT: This treatment plan has been presented to and reviewed with the patient, Nicole MimesDarlene Cuevas.  The patient has been given the opportunity to ask questions and make suggestions.  Lenox Pondserry J Dushaun Okey, RN 07/03/2018, 1:10 AM

## 2018-07-03 NOTE — H&P (Addendum)
Psychiatric Admission Assessment Adult  Patient Identification: Nicole MimesDarlene Cuevas MRN:  [id number] Date of Evaluation:  07/03/2018 Chief Complaint:  psychosis Principal Diagnosis: Schizophrenia (HCC) Diagnosis:   Patient Active Problem List   Diagnosis Date Noted  . Schizoaffective disorder (HCC) [F25.9] 07/02/2018  . Cocaine abuse (HCC) [F14.10] 07/02/2018  . Schizophrenia (HCC) [F20.9] 07/02/2018   History of Present Illness: 57 yo female admitted due to psychosis. She has history of schizophrenia. She was reported worsening paranoid and hallucinations. She reported seeing stabbings and people being hurt. She was also seeing spiders and various visual hallucinations. She lives in [place] and allegedly got on a bus aiming to go to [place] due to paranoia but ended up in [place]. She did admit to using cocaine yesterday.    Upon evaluation today, Pt is calm and polite. She states that she was "scared at my sister's house and we weren't getting along." She got on the bus to go to [place] "To find a new place to live. My brother said he knew of some low income housing." She states, "I got on the wrong bus in [place] and walked to the hospital." She states that she came here because "I was seeing things and hearing loud voices." She states that she always hears voices but they were much louder. They were telling her "You are going to die." She states that she is also having VH of "spiders and stuff." She states that this comes and goes. She also reports vague paranoia but very vague about this. She states that she has been "feeling hyper." She has not been sleeping well and "I have a lot on my mind." She denies SI or HI. She states that she was living with her sister in [place] but does not want to go back there and wants help getting a new apartment. She states "a boarding house is fine." She gets a disability check but has no money until the 3rd of next month. She does not want a group home. She  states that she sees Barnet Pallenise Campbell at [facility] in [place] and sees her monthly. She is not sure of the exact names of medications she is on. She admits she missed at least 2 days of her medications. She also admits to using crack cocaine "one time" and insists this is not a regular thing. She drank 1 beer 2 days ago and denies regular alcohol use. She is very vague with her symptoms but overall appears organized in thoughts. 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    Associated Signs/Symptoms: Depression Symptoms:  depressed mood, hopelessness, (Hypo) Manic Symptoms:  Impulsivity, Anxiety Symptoms:  Excessive Worry, Psychotic Symptoms:  Hallucinations: Auditory Visual PTSD Symptoms: Negative Total Time spent with patient: 45 minutes  Past Psychiatric History: History of schizoaffective disorder. She reports many past hospitalizations but states last was about 15 years ago. Per chart review, she had an ED visist in June of this year but was not admitted. They refilled 3 anti-psychotics at that time.  Per chart review, has been on Prolixin injections, Abilify, Zyprexa in the past.   Is the patient at risk to self? Yes.    Has the patient been a risk to self in the past 6 months? No.  Has the patient been a risk to self within the distant past? No.  Is the patient a risk to others? No.  Has the patient been a risk to others in the past  6 months? No.  Has the patient been a risk to others within the distant past? No.   Alcohol Screening: 1. How often do you have a drink containing alcohol?: Monthly or less 2. How many drinks containing alcohol do you have on a typical day when you are drinking?: 1 or 2 3. How often do you have six or more drinks on one occasion?: Never AUDIT-C Score: 1 4. How often during the last year have you found that you were not able to stop drinking once you had started?: Never 5. How often during  the last year have you failed to do what was normally expected from you becasue of drinking?: Never 6. How often during the last year have you needed a first drink in the morning to get yourself going after a heavy drinking session?: Never 7. How often during the last year have you had a feeling of guilt of remorse after drinking?: Never 8. How often during the last year have you been unable to remember what happened the night before because you had been drinking?: Never 9. Have you or someone else been injured as a result of your drinking?: No 10. Has a relative or friend or a doctor or another health worker been concerned about your drinking or suggested you cut down?: No Alcohol Use Disorder Identification Test Final Score (AUDIT): 1 Intervention/Follow-up: AUDIT Score <7 follow-up not indicated Substance Abuse History in the last 12 months:  Yes.  , crack cocaine use but claims it is very rare and drank 1 beer Consequences of Substance Abuse: Medical Consequences:  Psychosis Previous Psychotropic Medications: Yes  Psychological Evaluations: Yes  Past Medical History:  Past Medical History:  Diagnosis Date  . Pre-diabetes     Past Surgical History:  Procedure Laterality Date  . schizophrenia     Family History: History reviewed. No pertinent family history. Family Psychiatric  History: Unknown Tobacco Screening: Have you used any form of tobacco in the last 30 days? (Cigarettes, Smokeless Tobacco, Cigars, and/or Pipes): Yes Tobacco use, Select all that apply: 5 or more cigarettes per day Are you interested in Tobacco Cessation Medications?: Yes, will notify MD for an order Counseled patient on smoking cessation including recognizing danger situations, developing coping skills and basic information about quitting provided: Yes Social History:  Pt lives in [place] with her sister.  Social History   Substance and Sexual Activity  Alcohol Use Yes  . Alcohol/week: 1.0 standard drinks   . Types: 1 Cans of beer per week     Social History   Substance and Sexual Activity  Drug Use Yes  . Types: Cocaine    Additional Social History:                           Allergies:  Not on File Lab Results:  Results for orders placed or performed during the hospital encounter of 07/02/18 (from the past 48 hour(s))  Lipid panel     Status: None   Collection Time: 07/01/18  7:56 PM  Result Value Ref Range   Cholesterol 189 0 - 200 mg/dL   Triglycerides 161 <096 mg/dL   HDL 68 >04 mg/dL   Total CHOL/HDL Ratio 2.8 RATIO   VLDL 24 0 - 40 mg/dL   LDL Cholesterol 97 0 - 99 mg/dL    Comment:        Total Cholesterol/HDL:CHD Risk Coronary Heart Disease Risk Table  Men   Women  1/2 Average Risk   3.4   3.3  Average Risk       5.0   4.4  2 X Average Risk   9.6   7.1  3 X Average Risk  23.4   11.0        Use the calculated Patient Ratio above and the CHD Risk Table to determine the patient's CHD Risk.        ATP III CLASSIFICATION (LDL):  <100     mg/dL   Optimal  161-096  mg/dL   Near or Above                    Optimal  130-159  mg/dL   Borderline  045-409  mg/dL   High  >811     mg/dL   Very High Performed at [facility], [address] [place], [place] [address]   TSH     Status: None   Collection Time: 07/01/18  7:56 PM  Result Value Ref Range   TSH 0.655 0.350 - 4.500 uIU/mL    Comment: Performed by a 3rd Generation assay with a functional sensitivity of <=0.01 uIU/mL. Performed at [facility], [address] [place], [place] [address]     Blood Alcohol level:  Lab Results  Component Value Date   [facility] <10 07/01/2018    Metabolic Disorder Labs:  No results found for: HGBA1C, MPG No results found for: PROLACTIN Lab Results  Component Value Date   CHOL 189 07/01/2018   TRIG 121 07/01/2018   HDL 68 07/01/2018   CHOLHDL 2.8 07/01/2018   VLDL 24 07/01/2018   LDLCALC 97 07/01/2018    Current  Medications: Current Facility-Administered Medications  Medication Dose Route Frequency Provider Last Rate Last Dose  . acetaminophen (TYLENOL) tablet 650 mg  650 mg Oral Q6H PRN Clapacs, John T, MD      . alum & mag hydroxide-simeth (MAALOX/MYLANTA) 200-200-20 MG/5ML suspension 30 mL  30 mL Oral Q4H PRN Clapacs, John T, MD      . benztropine (COGENTIN) tablet 1 mg  1 mg Oral BID PRN Clapacs, John T, MD      . escitalopram (LEXAPRO) tablet 20 mg  20 mg Oral Daily Clapacs, John T, MD      . hydrOXYzine (ATARAX/VISTARIL) tablet 25 mg  25 mg Oral TID PRN Clapacs, John T, MD      . magnesium hydroxide (MILK OF MAGNESIA) suspension 30 mL  30 mL Oral Daily PRN Clapacs, John T, MD      . nicotine (NICODERM CQ - dosed in mg/24 hours) patch 14 mg  14 mg Transdermal Daily Clapacs, John T, MD      . OLANZapine (ZYPREXA) tablet 10 mg  10 mg Oral QHS Clapacs, John T, MD      . traZODone (DESYREL) tablet 100 mg  100 mg Oral QHS PRN Clapacs, Jackquline Denmark, MD       PTA Medications: Medications Prior to Admission  Medication Sig Dispense Refill Last Dose  . benztropine (COGENTIN) 1 MG tablet Take 1 tablet by mouth at bedtime.   Unknown at Unknown  . escitalopram (LEXAPRO) 20 MG tablet Take 1 tablet by mouth daily.   Unknown at Unknown  . hydrOXYzine (VISTARIL) 25 MG capsule Take 25 mg by mouth 2 (two) times daily as needed.   prn at prn  . OLANZapine (ZYPREXA) 10 MG tablet Take 1 tablet by mouth at bedtime.  Unknown at Unknown    Musculoskeletal: Strength & Muscle Tone: within normal limits Gait & Station: unsteady Patient leans: N/A  Psychiatric Specialty Exam: Physical Exam  Nursing note and vitals reviewed.   Review of Systems  Neurological: Positive for dizziness.  All other systems reviewed and are negative.   Blood pressure 125/78, pulse 91, temperature (!) 97.5 F (36.4 C), temperature source Oral, resp. rate 18, height 5\' 3"  (1.6 m), weight 100.2 kg, SpO2 97 %.Body mass index is 39.15 kg/m.   General Appearance: Disheveled, noticeable TD in hands and mouth  Eye Contact:  Fair  Speech:  Garbled  Volume:  Decreased  Mood:  Depressed  Affect:  Appropriate  Thought Process:  Very vague, but overall organized and appropriate  Orientation:  Full (Time, Place, and Person)  Thought Content:  Hallucinations: Auditory Visual and Paranoid Ideation  Suicidal Thoughts:  No  Homicidal Thoughts:  No  Memory:  Immediate;   Fair  Judgement:  Impaired  Insight:  Lacking  Psychomotor Activity:  Normal  Concentration:  Concentration: Poor  Recall:  Poor  Fund of Knowledge:  Fair  Language:  Fair  Akathisia:  No    AIMS (if indicated):    16  Assets:  Resilience  ADL's:  Intact  Cognition:  Impaired,  Mild  Sleep:       Treatment Plan Summary: 57 yo female admitted due to worsening psychosis. She also admitted to cocaine use which likely exacerbated her symptoms. She is very vague with symptoms and has no real rational plan other than "I want to get my own apartment in [place]." She has been off medications for at least 2 days. I called her pharmacy to confirm her medications. She is on Zyprexa 10 mg qhs, LExparo and cogentin  Plan:  Schizophrenia -Restart Zyprexa and increase to 15 mg qhs -Restart LExapro 10 mg daily -Stop Cogentin which can exacerbate TD -EKG not done yet  DM Restart Metformin 500 mg daily  White count slightly elevated. Need to Check UA  Dispo I attempted to call her sister, [name] a the number provided by the patient [phone]. There was a generic VM so message was not left. No other numbers of family members listed.   Observation Level/Precautions:  15 minute checks  Laboratory:  Done in ED  Psychotherapy:    Medications:    Consultations:    Discharge Concerns:    Estimated LOS: 5-7 days  Other:     Physician Treatment Plan for Primary Diagnosis: Schizophrenia (HCC) Long Term Goal(s): Improvement in symptoms so as ready for  discharge  Short Term Goals: Ability to verbalize feelings will improve   I certify that inpatient services furnished can reasonably be expected to improve the patient's condition.    Haskell Riling, MD 11/15/20197:50 AM

## 2018-07-03 NOTE — Progress Notes (Signed)
Patient ID: Nicole Cuevas, female   DOB: 05/30/1961, 57 y.o.   MRN: [id number] PER STATE REGULATIONS 482.30  THIS CHART WAS REVIEWED FOR MEDICAL NECESSITY WITH RESPECT TO THE PATIENT'S ADMISSION/DURATION OF STAY.  NEXT REVIEW DATE:07/06/18  Loura HaltBARBARA Dariana Garbett, RN, BSN CASE MANAGER

## 2018-07-03 NOTE — Plan of Care (Signed)
Patient just recently admitted to the unit and has not had sufficient time to show progressions. Will continue to monitor for progressions.    Problem: Education: Goal: Will be free of psychotic symptoms Outcome: Not Progressing Goal: Knowledge of the prescribed therapeutic regimen will improve Outcome: Not Progressing   Problem: Coping: Goal: Coping ability will improve Outcome: Not Progressing Goal: Will verbalize feelings Outcome: Not Progressing   Problem: Health Behavior/Discharge Planning: Goal: Compliance with prescribed medication regimen will improve Outcome: Not Progressing   Problem: Safety: Goal: Ability to redirect hostility and anger into socially appropriate behaviors will improve Outcome: Not Progressing Goal: Ability to remain free from injury will improve Outcome: Not Progressing   Problem: Safety: Goal: Periods of time without injury will increase Outcome: Not Progressing   Problem: Safety: Goal: Ability to disclose and discuss suicidal ideas will improve Outcome: Not Progressing

## 2018-07-03 NOTE — Progress Notes (Signed)
Patient given urine sampling cup and educated on need for ECG testing this morning. Patient resting currently, but reports she will try to be complaint with this today. Will notify day shift of this need.

## 2018-07-03 NOTE — BHH Group Notes (Signed)
07/03/2018 1PM  Type of Therapy and Topic:  Group Therapy:  Feelings around Relapse and Recovery  Participation Level:  Minimal   Description of Group:    Patients in this group will discuss emotions they experience before and after a relapse. They will process how experiencing these feelings, or avoidance of experiencing them, relates to having a relapse. Facilitator will guide patients to explore emotions they have related to recovery. Patients will be encouraged to process which emotions are more powerful. They will be guided to discuss the emotional reaction significant others in their lives may have to patients' relapse or recovery. Patients will be assisted in exploring ways to respond to the emotions of others without this contributing to a relapse.  Therapeutic Goals: 1. Patient will identify two or more emotions that lead to a relapse for them 2. Patient will identify two emotions that result when they relapse 3. Patient will identify two emotions related to recovery 4. Patient will demonstrate ability to communicate their needs through discussion and/or role plays   Summary of Patient Progress: Actively and appropriately engaged in the group. Patient practiced active listening when interacting with the facilitator and other group members.  She spoke about the fators that led up to her admission to the hospital and how she felt that someone was under her bed and taking pictures of her. Patient is still in the process of obtaining treatment goals.      Therapeutic Modalities:   Cognitive Behavioral Therapy Solution-Focused Therapy Assertiveness Training Relapse Prevention Therapy   Johny ShearsCassandra  Rodolfo Notaro, LCSW 07/03/2018 2:06 PM

## 2018-07-03 NOTE — Progress Notes (Signed)
Recreation Therapy Notes  Time: 9:30 am  Location: Craft Room  Behavioral response: Appropriate   Intervention Topic: Problem Solving  Discussion/Intervention:  Group content on today was focused on problem solving. The group described what problem solving is. Patients expressed how problems affect them and how they deal with problems. Individuals identified healthy ways to deal with problems. Patients explained what normally happens to them when they do not deal with problems. The group expressed reoccurring problems for them. The group participated in the intervention "Ways to Solve problems" where patients were given a chance to explore different ways to solve problems.  Clinical Observations/Feedback:  Patient attended group late due to unknown reasons. Individual was social with peers and staff while participating in the intervention.  Shenouda Genova LRT/CTRS         Litzi Binning 07/03/2018 11:56 AM 

## 2018-07-03 NOTE — Plan of Care (Signed)
Patient is alert and oriented X 4 , patient denies SI,HI but positive for auditory and visual hallucinations. Patient expressed feeling "sadness when I see the people." Patient states she is able to see family members. Patient is appropriate and animated. Patient is compliant with medication and treatment therapies. No self injurious behaviors observed. Patient rates depression 2/10 and hopelessness 2/10. Patient goal is to listen more, denies Pain 0/10. Problem: Education: Goal: Will be free of psychotic symptoms Outcome: Not Progressing Goal: Knowledge of the prescribed therapeutic regimen will improve Outcome: Progressing   Problem: Coping: Goal: Coping ability will improve Outcome: Not Progressing Goal: Will verbalize feelings Outcome: Progressing   Problem: Health Behavior/Discharge Planning: Goal: Compliance with prescribed medication regimen will improve Outcome: Progressing   Problem: Safety: Goal: Ability to redirect hostility and anger into socially appropriate behaviors will improve Outcome: Progressing Goal: Ability to remain free from injury will improve Outcome: Progressing

## 2018-07-03 NOTE — Progress Notes (Signed)
D: Received patient from [facility] Emergency Department at 23:30 with [facility] [place]. Patient skin assessment completed with Shaune Pollacklubukola, RN and Kori, BHT, skin is intact, with no contraband found and all unit prohibited items locked and stored away for discharge. Pt. Was admitted under the services of, Dr. Johnella MoloneyMcNew.  Patient upon admissions was pleasant and cooperative, but guarded with a blanket on top of her head. Pt. Eye contact was avertive and brief. Pt. Was able to complete the admissions process, but little information was able to be obtained before the patient reported she was very sleepy and wanted to go to sleep if she could. Pt. During our conversation though did report she is actively hallucinating seeing and hearing things. Pt. Is not very clear on what she is seeing, just states, "horrible things" and it is clear they're disturbing to her. Pt. Reports active depression that is high, but does not report other emotional feelings. Pt. Denies SI/HI and is able to verbally contract for safety. Pt. Denies any pain. Pt. Given admissions education and offered food and drink, but denies, just wanted to rest.    A: Patient oriented to unit/room/call light. Patient was encourage to participate in unit activities and continue with plan of care being put into place. Q x 15 minute observation checks were initiated for safety.   R: Patient is receptive to treatment plan put into place with patient direct input. Pt. Presents to the unit eager to receive help and motivated for treatment.

## 2018-07-04 DIAGNOSIS — F203 Undifferentiated schizophrenia: Secondary | ICD-10-CM

## 2018-07-04 NOTE — [facility] Group Notes (Signed)
LCSW Group Therapy Note  07/04/2018 1:15pm  Type of Therapy and Topic:  Group Therapy:  Cognitive Distortions  Participation Level:  Did Not Attend   Description of Group:    Patients in this group will be introduced to the topic of cognitive distortions.  Patients will identify and describe cognitive distortions, describe the feelings these distortions create for them.  Patients will identify one or more situations in their personal life where they have cognitively distorted thinking and will verbalize challenging this cognitive distortion through positive thinking skills.  Patients will practice the skill of using positive affirmations to challenge cognitive distortions using affirmation cards.    Therapeutic Goals:  1. Patient will identify two or more cognitive distortions they have used 2. Patient will identify one or more emotions that stem from use of a cognitive distortion 3. Patient will demonstrate use of a positive affirmation to counter a cognitive distortion through discussion and/or role play. 4. Patient will describe one way cognitive distortions can be detrimental to wellness   Summary of Patient Progress: Pt was invited to attend group but chose not to attend. CSW will continue to encourage pt to attend group throughout their admission.      Therapeutic Modalities:   Cognitive Behavioral Therapy Motivational Interviewing   Jackson Coffield  [name], LCSW 07/04/2018 11:33 AM

## 2018-07-04 NOTE — Plan of Care (Signed)
  Problem: Education: Goal: Will be free of psychotic symptoms Outcome: Progressing  Patient appears less psychotic  

## 2018-07-04 NOTE — Progress Notes (Signed)
D: Pt denies SI/HI, contracts for safety. Pt. Continues to report auditory and visual hallucinations that are disturbing to her. Pt. Reports when describing her hallucinations, "I see pictures" and "the voices talk down to me". Pt is pleasant and cooperative, no behavioral concerns. Pt. Frequently isolative and withdrawn to her room, but is up for meals. Pt. Eating well. Pt. Up a few times in the day room watching tv later in the day, but not socializing very much. Pt. Reports anxiety and depression as, "still there", but does not give numerical value. Pt. Earlier in the day would not remove blanket from her head, but later in the day affect is much brighter, smiling occasionally, and had removed the blanket covering her up, hiding her face.   A: Q x 15 minute observation checks were completed for safety. Patient was provided with education, but needs reinforcement.  Patient was given/offered medications per orders. Patient  was encourage to attend groups, participate in unit activities and continue with plan of care. Pt. Chart and plans of care reviewed. Pt. Given support and encouragement.   R: Patient is complaint with medication and unit procedures with direciton and encouragement. Pt. ecg complete with copy placed on chart for MD review.             Precautionary checks every 15 minutes for safety maintained, room free of safety hazards, patient sustains no injury or falls during this shift. Will endorse care to next shift.

## 2018-07-04 NOTE — Plan of Care (Signed)
Pt. Complaint with medications. Pt. Denies si/hi, verbally is able to contract for safety. Pt. Reports she can remain safe while on the unit. Pt. Engages more during assessments, verbalizing more feelings then previous encounters. Pt. Continues to report auditory and visual hallucinations that are disturbing to her.    Problem: Education: Goal: Will be free of psychotic symptoms Outcome: Not Progressing   Problem: Coping: Goal: Will verbalize feelings Outcome: Progressing   Problem: Health Behavior/Discharge Planning: Goal: Compliance with prescribed medication regimen will improve Outcome: Progressing   Problem: Safety: Goal: Ability to remain free from injury will improve Outcome: Progressing   Problem: Safety: Goal: Ability to disclose and discuss suicidal ideas will improve Outcome: Progressing

## 2018-07-04 NOTE — Progress Notes (Signed)
Patient alert and oriented x 4, denies SI/HI/AVH stated " I am not seeing or hearing dead people" appears less anxious, minimal interaction with peers and staff, thoughts are organized and coherent , c/o earlier during the shift of indigestion and was medicated per S.O. Emotional support and encouragement given to patient, she attended evening wrap up group, 15 minutes safety checks maintained will continue to monitor.  .Marland Kitchen

## 2018-07-04 NOTE — Progress Notes (Signed)
[facility]  07/04/2018 7:12 PM Nicole MimesDarlene Cuevas  MRN:  [id number] Subjective:  Pt seen and chart reviewed.  She continues to be somewhat psychotic. She said that she feels "horrible" because 'the steam in my body is coming out every 12 minutes". She also stated that she hears voices telling her that her family were killed or are dying.    She tolerates zyprexa and lexapro well.   Principal Problem: Schizophrenia (HCC) Diagnosis:   Patient Active Problem List   Diagnosis Date Noted  . Schizoaffective disorder (HCC) [F25.9] 07/02/2018  . Cocaine abuse (HCC) [F14.10] 07/02/2018  . Schizophrenia (HCC) [F20.9] 07/02/2018   Total Time spent with patient: 30 minutes  Past Psychiatric History: History of schizoaffective disorder. She reports many past hospitalizations but states last was about 15 years ago. Per chart review, she had an ED visist in June of this year but was not admitted. They refilled 3 anti-psychotics at that time.  Per chart review, has been on Prolixin injections, Abilify, Zyprexa in the past.   Past Medical History:  Past Medical History:  Diagnosis Date  . Pre-diabetes     Past Surgical History:  Procedure Laterality Date  . schizophrenia     Family History: History reviewed. No pertinent family history. Family Psychiatric  History: unknown Social History:  Social History   Substance and Sexual Activity  Alcohol Use Yes  . Alcohol/week: 1.0 standard drinks  . Types: 1 Cans of beer per week     Social History   Substance and Sexual Activity  Drug Use Yes  . Types: Cocaine    Social History   Socioeconomic History  . Marital status: Single    Spouse name: Not on file  . Number of children: Not on file  . Years of education: Not on file  . Highest education level: Not on file  Occupational History  . Not on file  Social Needs  . Financial resource strain: Not on file  . Food insecurity:    Worry: Not on file    Inability: Not on file  .  Transportation needs:    Medical: Not on file    Non-medical: Not on file  Tobacco Use  . Smoking status: Current Every Day Smoker    Packs/day: 1.00    Years: 40.00    Pack years: 40.00  . Smokeless tobacco: Never Used  Substance and Sexual Activity  . Alcohol use: Yes    Alcohol/week: 1.0 standard drinks    Types: 1 Cans of beer per week  . Drug use: Yes    Types: Cocaine  . Sexual activity: Not on file  Lifestyle  . Physical activity:    Days per week: Not on file    Minutes per session: Not on file  . Stress: Not on file  Relationships  . Social connections:    Talks on phone: Not on file    Gets together: Not on file    Attends religious service: Not on file    Active member of club or organization: Not on file    Attends meetings of clubs or organizations: Not on file    Relationship status: Not on file  Other Topics Concern  . Not on file  Social History Narrative  . Not on file   Additional Social History:   Sleep: Fair  Appetite:  Fair  Current Medications: Current Facility-Administered Medications  Medication Dose Route Frequency Provider Last Rate Last Dose  . acetaminophen (TYLENOL) tablet 650  mg  650 mg Oral Q6H PRN Clapacs, John T, MD      . alum & mag hydroxide-simeth (MAALOX/MYLANTA) 200-200-20 MG/5ML suspension 30 mL  30 mL Oral Q4H PRN Clapacs, John T, MD   30 mL at 07/04/18 1440  . escitalopram (LEXAPRO) tablet 20 mg  20 mg Oral Daily Clapacs, John T, MD   20 mg at 07/04/18 0846  . hydrOXYzine (ATARAX/VISTARIL) tablet 25 mg  25 mg Oral TID PRN Clapacs, Jackquline Denmark, MD   25 mg at 07/03/18 2207  . magnesium hydroxide (MILK OF MAGNESIA) suspension 30 mL  30 mL Oral Daily PRN Clapacs, John T, MD      . metFORMIN (GLUCOPHAGE) tablet 500 mg  500 mg Oral Q breakfast McNew, Holly R, MD   500 mg at 07/04/18 0846  . nicotine (NICODERM CQ - dosed in mg/24 hours) patch 14 mg  14 mg Transdermal Daily Clapacs, Jackquline Denmark, MD   14 mg at 07/04/18 0846  . OLANZapine  (ZYPREXA) tablet 15 mg  15 mg Oral QHS Haskell Riling, MD   15 mg at 07/03/18 2207  . traZODone (DESYREL) tablet 50 mg  50 mg Oral QHS PRN Haskell Riling, MD   50 mg at 07/03/18 2207    Lab Results:  Results for orders placed or performed during the hospital encounter of 07/02/18 (from the past 48 hour(s))  Urinalysis, Complete w Microscopic     Status: Abnormal   Collection Time: 07/03/18  6:31 PM  Result Value Ref Range   Color, Urine YELLOW (A) YELLOW   APPearance CLEAR (A) CLEAR   Specific Gravity, Urine 1.011 1.005 - 1.030   pH 8.0 5.0 - 8.0   Glucose, UA NEGATIVE NEGATIVE mg/dL   Hgb urine dipstick SMALL (A) NEGATIVE   Bilirubin Urine NEGATIVE NEGATIVE   Ketones, ur NEGATIVE NEGATIVE mg/dL   Protein, ur NEGATIVE NEGATIVE mg/dL   Nitrite NEGATIVE NEGATIVE   Leukocytes, UA NEGATIVE NEGATIVE   RBC / HPF 6-10 0 - 5 RBC/hpf   WBC, UA NONE SEEN 0 - 5 WBC/hpf   Bacteria, UA RARE (A) NONE SEEN   Squamous Epithelial / LPF 0-5 0 - 5   Mucus PRESENT     Comment: Performed at [facility], [address] [place], [place] [address]    Blood Alcohol level:  Lab Results  Component Value Date   [facility] <10 07/01/2018    Metabolic Disorder Labs: Lab Results  Component Value Date   HGBA1C 6.3 (H) 07/01/2018   MPG 134.11 07/01/2018   No results found for: PROLACTIN Lab Results  Component Value Date   CHOL 189 07/01/2018   TRIG 121 07/01/2018   HDL 68 07/01/2018   CHOLHDL 2.8 07/01/2018   VLDL 24 07/01/2018   LDLCALC 97 07/01/2018    Physical Findings: AIMS: Facial and Oral Movements Muscles of Facial Expression: Moderate Lips and Perioral Area: Moderate Jaw: Minimal Tongue: Minimal,Extremity Movements Upper (arms, wrists, hands, fingers): Moderate Lower (legs, knees, ankles, toes): None, normal, Trunk Movements Neck, shoulders, hips: None, normal, Overall Severity Severity of abnormal movements (highest score from questions above): Moderate Incapacitation due to  abnormal movements: Minimal Patient's awareness of abnormal movements (rate only patient's report): Aware, no distress, Dental Status Current problems with teeth and/or dentures?: Yes Does patient usually wear dentures?: Yes  CIWA:    COWS:     Musculoskeletal: Strength & Muscle Tone: within normal limits Gait & Station: normal Patient leans: N/A  Psychiatric Specialty  Exam: Physical Exam  ROS  Blood pressure 133/79, pulse 83, temperature 98.3 F (36.8 C), temperature source Oral, resp. rate 16, height 5\' 3"  (1.6 m), weight 100.2 kg, SpO2 96 %.Body mass index is 39.15 kg/m.  General Appearance: Bizarre  Eye Contact:  Good  Speech:  Clear and Coherent  Volume:  Normal  Mood:  Anxious and Depressed  Affect:  Constricted and Labile  Thought Process:  Descriptions of Associations: Tangential  Orientation:  Full (Time, Place, and Person)  Thought Content:  Hallucinations: Auditory  Suicidal Thoughts:  No  Homicidal Thoughts:  No  Memory:  Immediate;   Fair  Judgement:  Fair  Insight:  Lacking  Psychomotor Activity:  Normal  Concentration:  Concentration: Fair  Recall:  [employer] of Knowledge:  Fair  Language:  Fair      AIMS (if indicated):     Assets:  Desire for Improvement  ADL's:  Intact  Cognition:  WNL  Sleep:  Number of Hours: 7.5     Treatment Plan Summary: Daily contact with patient to assess and evaluate symptoms and progress in treatment and Medication management   57 yo female admitted due to worsening psychosis. She also admitted to cocaine use which likely exacerbated her symptoms. She is very vague with symptoms and has no real rational plan other than "I want to get my own apartment in [place]." She has been off medications for at least 2 days. I called her pharmacy to confirm her medications. She is on Zyprexa 10 mg qhs, LExparo and cogentin  Plan:  Schizophrenia -Increase Zyprexa t0 20 mg qhs -Continue  LExapro 10 mg daily -Stop Cogentin  which can exacerbate TD -EKG, QTc is 490  DM -continue Metformin 500 mg daily  UTI White count slightly elevated. UA: rare bacteria. Unlikely having UTI. Will monitor Sx.   Dispo I attempted to call her sister, [name] a the number provided by the patient [phone]. There was a generic VM so message was not left. No other numbers of family members listed.   Sevyn Markham, MD 07/04/2018, 7:12 PM

## 2018-07-05 LAB — GLUCOSE, CAPILLARY: GLUCOSE-CAPILLARY: 119 mg/dL — AB (ref 70–99)

## 2018-07-05 MED ORDER — OLANZAPINE 10 MG PO TABS
10.0000 mg | ORAL_TABLET | Freq: Every morning | ORAL | Status: DC
  Administered 2018-07-05 – 2018-07-11 (×7): 10 mg via ORAL
  Filled 2018-07-05 (×7): qty 1

## 2018-07-05 MED ORDER — METFORMIN HCL 500 MG PO TABS
500.0000 mg | ORAL_TABLET | Freq: Two times a day (BID) | ORAL | Status: DC
  Administered 2018-07-05 – 2018-07-11 (×12): 500 mg via ORAL
  Filled 2018-07-05 (×12): qty 1

## 2018-07-05 MED ORDER — OLANZAPINE 10 MG PO TABS
20.0000 mg | ORAL_TABLET | Freq: Every day | ORAL | Status: DC
  Administered 2018-07-05 – 2018-07-10 (×6): 20 mg via ORAL
  Filled 2018-07-05 (×6): qty 2

## 2018-07-05 NOTE — [facility] Group Notes (Signed)
[facility] Group Notes:  (Nursing/MHT/Case Management/Adjunct)  Date:  07/05/2018  Time:  10:20 PM  Type of Therapy:  Group Therapy  Participation Level:  Active  Participation Quality:  Appropriate  Affect:  Appropriate  Cognitive:  Appropriate  Insight:  Appropriate  Engagement in Group:  Engaged  Modes of Intervention:  Discussion  Summary of Progress/Problems: Agustin CreeDarlene stated her goal was to take her medication. Evelise stated she did not want to gain to much weight while on the unit. MHT reviewed rules and expectations of the unit. MHT informed patients of visitation and phone hours. MHT informed patients of what was allowed on the unit. MHT encouraged patient not to share or trade snacks. MHT informed patient's snacks were not allowed in rooms. MHT informed patients that routine checks would be conducted throughout the night and to cover accordingly. MHT informed patients that room temperature could be adjusted as needed. MHT informed patient's not to use last names or share personal information. MHT informed patients of community resources. MHT provided supportive counseling to address making necessary changes in life. MHT encouraged patients to develop discharge plan to address needs in community.  Jinger NeighborsKeith D Allyna Pittsley 07/05/2018, 10:20 PM

## 2018-07-05 NOTE — B[facility] Counselor (Signed)
Adult Comprehensive Assessment  Patient ID: Nicole Cuevas, female   DOB: 01-24-1961, 57 y.o.   MRN: [id number]  Information Source: Information source: Patient  Current Stressors:  Patient states their primary concerns and needs for treatment are:: "hearing voices, seeing things" Patient states their goals for this hospitilization and ongoing recovery are:: "somewhere to stay and a jobAnimator" Educational / Learning stressors: "brain defects" Employment / Job issues: none reported Family Relationships: "so-soEngineer, petroleum" Financial / Lack of resources (include bankruptcy): disability Housing / Lack of housing: homeless Physical health (include injuries & life threatening diseases): knee problems Social relationships: 'good" Substance abuse: pt denies use Bereavement / Loss: none reported  Living/Environment/Situation:  Living Arrangements: Other relatives Who else lives in the home?: sister How long has patient lived in current situation?: 5 months What is atmosphere in current home: Temporary  Family History:  Marital status: Single Are you sexually active?: No What is your sexual orientation?: heterosexual Has your sexual activity been affected by drugs, alcohol, medication, or emotional stress?: no Does patient have children?: No  Childhood History:  By whom was/is the patient raised?: Both parents Description of patient's relationship with caregiver when they were a child: "good" Patient's description of current relationship with people who raised him/her: deceased How were you disciplined when you got in trouble as a child/adolescent?: "everything was good" Does patient have siblings?: Yes Number of Siblings: 10 Description of patient's current relationship with siblings: "ok" Did patient suffer any verbal/emotional/physical/sexual abuse as a child?: No Did patient suffer from severe childhood neglect?: No Has patient ever been sexually abused/assaulted/raped as an adolescent or adult?:  No Was the patient ever a victim of a crime or a disaster?: No Witnessed domestic violence?: No Has patient been effected by domestic violence as an adult?: No  Education:  Highest grade of school patient has completed: some college Currently a Consulting civil engineerstudent?: No Learning disability?: No  Employment/Work Situation:   Employment situation: On disability Why is patient on disability: mental health How long has patient been on disability: 30 years  Patient's job has been impacted by current illness: No What is the longest time patient has a held a job?: 20 years 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?: No Are There Guns or Other Weapons in Your Home?: No  Financial Resources:   Surveyor, quantityinancial resources: Insurance claims handlereceives SSDI, [employer]icaid, [employer]icare, Food stamps Does patient have a Lawyerrepresentative payee or guardian?: Yes Name of representative payee or guardian: Nicole Cuevas - sister [phone]  Alcohol/Substance Abuse:   What has been your use of drugs/alcohol within the last 12 months?: denies use If attempted suicide, did drugs/alcohol play a role in this?: No Alcohol/Substance Abuse Treatment Hx: Denies past history Has alcohol/substance abuse ever caused legal problems?: No  Social Support System:   Patient's Community Support System: Fair Development worker, communityDescribe Community Support System: family Type of faith/religion: Disciple How does patient's faith help to cope with current illness?: "helps me no think about it"  Leisure/Recreation:   Leisure and Hobbies: "sports"  Strengths/Needs:   What is the patient's perception of their strengths?: "I was good at losing weight" Patient states they can use these personal strengths during their treatment to contribute to their recovery: "I don't know" Patient states these barriers may affect/interfere with their treatment: none reported Patient states these barriers may  affect their return to the community: none reported  Discharge Plan:   Currently receiving community mental health services:  Yes (From Whom)(Dr. Barnet Pall- [facility]) Patient states concerns and preferences for aftercare planning are: Pt reports she will see Dr. Orvan Falconer if she finds a plce to live close to the office Patient states they will know when they are safe and ready for discharge when: "no more voices" Does patient have access to transportation?: No Does patient have financial barriers related to discharge medications?: No Plan for no access to transportation at discharge: TBD with CSW- Pt reports she needs help with transportation Plan for living situation after discharge: TBD with CSW- Pt reports she needs assistance with housing, she is not planning on returning to her sister's home. Will patient be returning to same living situation after discharge?: No  Summary/Recommendations:   Summary and Recommendations (to be completed by the evaluator): Patient is a 57 year old female admitted involuntarily and diagnosed with Schizophrenia. Patient admitted due to psychosis. She has history of schizophrenia. She was reported worsening paranoid and hallucinations. She reported seeing stabbings and people being hurt. She was also seeing spiders and various visual hallucinations. She lives in [place] and allegedly got on a bus aiming to go to [place] due to paranoia but ended up in [place]. She did admit to using cocaine yesterday. Patient will benefit from crisis stabilization, medication evaluation, group therapy and psychoeducation. In addition to case management for discharge planning. At discharge it is recommended that patient adhere to the established discharge plan and continue treatment.   Nicole Cuevas  [name]. 07/05/2018

## 2018-07-05 NOTE — Plan of Care (Signed)
In bed but pleasant and cooperative. Reports that she has been hearing cursing voices.

## 2018-07-05 NOTE — Progress Notes (Signed)
D: Pt denies SI/HI, verbally is able to contract for safety. Pt is pleasant and cooperative, engaging with staff and peers. Pt. Interactions are minimal, but does answer assessment questions. Pt. Reports continued AVH stating, "I see pictures of my family and spiders", "I hear praying and the voices are much nicer to me now". Pt. When talking about her anxiety and depression reports, "it's been better", reporting it's improved.  Patient Interactions appropriate. Pt. Isolative and withdrawn frequently this evening, just reports wanting to rest. Pt. Up for snacks, but no group attendances.   A: Q x 15 minute observation checks were completed for safety. Patient was provided with education, but needs reinforcement.  Patient was given/offered medications per orders. Patient  was encourage to attend groups, participate in unit activities and continue with plan of care. Pt. Chart and plans of care reviewed. Pt. Given support and encouragement.   R: Patient is complaint with medication and unit procedures with Directon and encouragement. Reports some knee discomfort, but denies need for PRN medications. Pt. Can utilize rolling walker if needed, but has been steady on her feet and appropriate. Falls risk education given. Pt. Blood sugars monitored per MD orders.               Precautionary checks every 15 minutes for safety maintained, room free of safety hazards, patient sustains no injury or falls during this shift. Will endorse care to next shift.

## 2018-07-05 NOTE — Progress Notes (Signed)
Nicole Cuevas was in bed awake upon the beginning of this shift. Reported that she did not feel like participating in group activities. Alert and oriented but reporting that she continues to hear voices. "they are just cursing". Patient was pleasant and cooperative. Received medications in room and did not have any other concerns. Was encouraged to call staff as needed. Emotional support provided. Safety monitored at Q 15 mn level of observations as needed.

## 2018-07-05 NOTE — Plan of Care (Signed)
Pt. Is complaint with medications. Pt. Denies si/hi, verbally is able to contract for safety. Pt. Continues to report auditory and visual hallucinations that are disturbing to her.    Problem: Education: Goal: Will be free of psychotic symptoms Outcome: Not Progressing   Problem: Health Behavior/Discharge Planning: Goal: Compliance with prescribed medication regimen will improve Outcome: Progressing   Problem: Safety: Goal: Periods of time without injury will increase Outcome: Progressing   Problem: Safety: Goal: Ability to disclose and discuss suicidal ideas will improve Outcome: Progressing   Problem: Safety: Goal: Ability to remain free from injury will improve Outcome: Progressing

## 2018-07-05 NOTE — Progress Notes (Signed)
[facility] MD Progress Note  07/05/2018 12:54 PM Nicole Cuevas  MRN:  [id number] Subjective:  Pt seen and chart reviewed.  She continues to report psychotic delusions and hallucinations. She said that "the voices tell me that people are getting brain washed, and my family are getting killed!" Last night, she said that she did not sleep well because "people are in my bed touching my emotion! I have to move 5 times!"  She also stated that there are pins and needles in her abdomen, and making her sick.  But no N/V/D.   Her CBG is slightly high, and she said that she take 2 tabs of metformin at home.   Principal Problem: Schizophrenia (HCC) Diagnosis:   Patient Active Problem List   Diagnosis Date Noted  . Schizoaffective disorder (HCC) [F25.9] 07/02/2018  . Cocaine abuse (HCC) [F14.10] 07/02/2018  . Schizophrenia (HCC) [F20.9] 07/02/2018   Total Time spent with patient: 20 minutes  Past Psychiatric History: History of schizoaffective disorder. She reports many past hospitalizations but states last was about 15 years ago. Per chart review, she had an ED visist in June of this year but was not admitted. They refilled 3 anti-psychotics at that time.  Per chart review, has been on Prolixin injections, Abilify, Zyprexa in the past.   Past Medical History:  Past Medical History:  Diagnosis Date  . Pre-diabetes     Past Surgical History:  Procedure Laterality Date  . schizophrenia     Family History: History reviewed. No pertinent family history. Family Psychiatric  History: unknown Social History:  Social History   Substance and Sexual Activity  Alcohol Use Yes  . Alcohol/week: 1.0 standard drinks  . Types: 1 Cans of beer per week     Social History   Substance and Sexual Activity  Drug Use Yes  . Types: Cocaine    Social History   Socioeconomic History  . Marital status: Single    Spouse name: Not on file  . Number of children: Not on file  . Years of education: Not on file  .  Highest education level: Not on file  Occupational History  . Not on file  Social Needs  . Financial resource strain: Not on file  . Food insecurity:    Worry: Not on file    Inability: Not on file  . Transportation needs:    Medical: Not on file    Non-medical: Not on file  Tobacco Use  . Smoking status: Current Every Day Smoker    Packs/day: 1.00    Years: 40.00    Pack years: 40.00  . Smokeless tobacco: Never Used  Substance and Sexual Activity  . Alcohol use: Yes    Alcohol/week: 1.0 standard drinks    Types: 1 Cans of beer per week  . Drug use: Yes    Types: Cocaine  . Sexual activity: Not on file  Lifestyle  . Physical activity:    Days per week: Not on file    Minutes per session: Not on file  . Stress: Not on file  Relationships  . Social connections:    Talks on phone: Not on file    Gets together: Not on file    Attends religious service: Not on file    Active member of club or organization: Not on file    Attends meetings of clubs or organizations: Not on file    Relationship status: Not on file  Other Topics Concern  . Not on file  Social  History Narrative  . Not on file   Additional Social History:   Sleep: Fair  Appetite:  Fair  Current Medications: Current Facility-Administered Medications  Medication Dose Route Frequency Provider Last Rate Last Dose  . acetaminophen (TYLENOL) tablet 650 mg  650 mg Oral Q6H PRN Clapacs, John T, MD      . alum & mag hydroxide-simeth (MAALOX/MYLANTA) 200-200-20 MG/5ML suspension 30 mL  30 mL Oral Q4H PRN Clapacs, Jackquline Denmark, MD   30 mL at 07/05/18 0901  . escitalopram (LEXAPRO) tablet 20 mg  20 mg Oral Daily Clapacs, John T, MD   20 mg at 07/05/18 0900  . hydrOXYzine (ATARAX/VISTARIL) tablet 25 mg  25 mg Oral TID PRN Clapacs, Jackquline Denmark, MD   25 mg at 07/03/18 2207  . magnesium hydroxide (MILK OF MAGNESIA) suspension 30 mL  30 mL Oral Daily PRN Clapacs, John T, MD      . metFORMIN (GLUCOPHAGE) tablet 500 mg  500 mg Oral Q  breakfast McNew, Ileene Hutchinson, MD   500 mg at 07/05/18 0900  . nicotine (NICODERM CQ - dosed in mg/24 hours) patch 14 mg  14 mg Transdermal Daily Clapacs, John T, MD   14 mg at 07/05/18 0900  . OLANZapine (ZYPREXA) tablet 15 mg  15 mg Oral QHS Haskell Riling, MD   15 mg at 07/04/18 2146  . traZODone (DESYREL) tablet 50 mg  50 mg Oral QHS PRN Haskell Riling, MD   50 mg at 07/03/18 2207    Lab Results:  Results for orders placed or performed during the hospital encounter of 07/02/18 (from the past 48 hour(s))  Urinalysis, Complete w Microscopic     Status: Abnormal   Collection Time: 07/03/18  6:31 PM  Result Value Ref Range   Color, Urine YELLOW (A) YELLOW   APPearance CLEAR (A) CLEAR   Specific Gravity, Urine 1.011 1.005 - 1.030   pH 8.0 5.0 - 8.0   Glucose, UA NEGATIVE NEGATIVE mg/dL   Hgb urine dipstick SMALL (A) NEGATIVE   Bilirubin Urine NEGATIVE NEGATIVE   Ketones, ur NEGATIVE NEGATIVE mg/dL   Protein, ur NEGATIVE NEGATIVE mg/dL   Nitrite NEGATIVE NEGATIVE   Leukocytes, UA NEGATIVE NEGATIVE   RBC / HPF 6-10 0 - 5 RBC/hpf   WBC, UA NONE SEEN 0 - 5 WBC/hpf   Bacteria, UA RARE (A) NONE SEEN   Squamous Epithelial / LPF 0-5 0 - 5   Mucus PRESENT     Comment: Performed at [facility], [address] [place], [place] [address]    Blood Alcohol level:  Lab Results  Component Value Date   [facility] <10 07/01/2018    Metabolic Disorder Labs: Lab Results  Component Value Date   HGBA1C 6.3 (H) 07/01/2018   MPG 134.11 07/01/2018   No results found for: PROLACTIN Lab Results  Component Value Date   CHOL 189 07/01/2018   TRIG 121 07/01/2018   HDL 68 07/01/2018   CHOLHDL 2.8 07/01/2018   VLDL 24 07/01/2018   LDLCALC 97 07/01/2018    Physical Findings: AIMS: Facial and Oral Movements Muscles of Facial Expression: Moderate Lips and Perioral Area: Moderate Jaw: Minimal Tongue: Minimal,Extremity Movements Upper (arms, wrists, hands, fingers): Moderate Lower (legs, knees,  ankles, toes): None, normal, Trunk Movements Neck, shoulders, hips: None, normal, Overall Severity Severity of abnormal movements (highest score from questions above): Moderate Incapacitation due to abnormal movements: Minimal Patient's awareness of abnormal movements (rate only patient's report): Aware, no distress, Dental  Status Current problems with teeth and/or dentures?: Yes Does patient usually wear dentures?: Yes  CIWA:    COWS:     Musculoskeletal: Strength & Muscle Tone: within normal limits Gait & Station: normal Patient leans: N/A  Psychiatric Specialty Exam: Physical Exam   ROS   Blood pressure 112/63, pulse 78, temperature 98.3 F (36.8 C), temperature source Oral, resp. rate 16, height 5\' 3"  (1.6 m), weight 100.2 kg, SpO2 94 %.Body mass index is 39.15 kg/m.  General Appearance: Bizarre  Eye Contact:  Good  Speech:  Clear and Coherent  Volume:  Normal  Mood:  Anxious and Depressed  Affect:  Constricted and Labile  Thought Process:  Descriptions of Associations: Tangential  Orientation:  Full (Time, Place, and Person)  Thought Content:  Hallucinations: Auditory  Suicidal Thoughts:  No  Homicidal Thoughts:  No  Memory:  Immediate;   Fair  Judgement:  Fair  Insight:  Lacking  Psychomotor Activity:  Normal  Concentration:  Concentration: Fair  Recall:  [employer]  Fund of Knowledge:  Fair  Language:  Fair      AIMS (if indicated):     Assets:  Desire for Improvement  ADL's:  Intact  Cognition:  WNL  Sleep:  Number of Hours: 7     Treatment Plan Summary: Daily contact with patient to assess and evaluate symptoms and progress in treatment and Medication management   57 yo female admitted due to worsening psychosis. She also admitted to cocaine use which likely exacerbated her symptoms. She is very vague with symptoms and has no real rational plan other than "[place] She has been off medications for at least 2 days. I  called her pharmacy to confirm her medications. She is on Zyprexa 10 mg qhs, LExparo and cogentin prior to hospitalization.   Plan:  Schizophrenia -Increase Zyprexa to 10mg  qam and  20 mg qhs to better control her delusions. Monitor the effect, it is possible these are fixed delusions.  Will also monitor her CBG ad zyprexa can worsen her DM.  -Continue  LExapro 20 mg daily -Stop Cogentin which can exacerbate TD -EKG, QTc is 490  DM -increase Metformin to 500 mg BID (her home dose) -ADD CBG monitoring.   UTI White count slightly elevated. UA: rare bacteria. Unlikely having UTI. Will monitor Sx.   Dispo I attempted to call her sister, Irving Burtonmily a the number provided by the patient [phone]. There was a generic VM so message was not left. No other numbers of family members listed.   Shanna Un, MD 07/05/2018, 12:54 PM

## 2018-07-05 NOTE — [facility] Group Notes (Signed)
LCSW Group Therapy Note 07/05/2018 1:15pm  Type of Therapy and Topic: Group Therapy: Feelings Around Returning Home & Establishing a Supportive Framework and Supporting Oneself When Supports Not Available  Participation Level: Active  Description of Group:  Patients first processed thoughts and feelings about upcoming discharge. These included fears of upcoming changes, lack of change, new living environments, judgements and expectations from others and overall stigma of mental health issues. The group then discussed the definition of a supportive framework, what that looks and feels like, and how do to discern it from an unhealthy non-supportive network. The group identified different types of supports as well as what to do when your family/friends are less than helpful or unavailable  Therapeutic Goals  1. Patient will identify one healthy supportive network that they can use at discharge. 2. Patient will identify one factor of a supportive framework and how to tell it from an unhealthy network. 3. Patient able to identify one coping skill to use when they do not have positive supports from others. 4. Patient will demonstrate ability to communicate their needs through discussion and/or role plays.  Summary of Patient Progress:  Pt reported feeling "better." Pt engaged during group session. As patients processed their anxiety about discharge and described healthy supports patient shared she is not ready to be discharge. She stated she does not have a support system. Patients identified at least one self-care tool they were willing to use after discharge, walking, watching sports.   Therapeutic Modalities Cognitive Behavioral Therapy Motivational Interviewing   Johnnye SimaNNIA  [name], LCSW 07/05/2018 12:33 PM

## 2018-07-05 NOTE — Plan of Care (Signed)
Patient denies SI/HI, however, she does endorse AVH stating to this writer that she sees "spiders, little people, and family members". Patient also states that she hears voices telling her "they'll kill me, so I left from [place], I can't sleep". Patient rates her depression/anxiety a "10/10" stating that she wants "them to stop touching my emotions", but can not state why she's anxious. Patient has attended and participated in today's social work group without any issues at this time. Patient has been in compliance with her prescribed medication regimen and has not voiced any further questions/concerns. Patient has the ability to redirect her anger and frustrations into socially appropriate behaviors. Patient has been free from injury and remains safe on the unit at this time.  Problem: Education: Goal: Will be free of psychotic symptoms Outcome: Progressing Goal: Knowledge of the prescribed therapeutic regimen will improve Outcome: Progressing   Problem: Coping: Goal: Coping ability will improve Outcome: Progressing Goal: Will verbalize feelings Outcome: Progressing   Problem: Health Behavior/Discharge Planning: Goal: Compliance with prescribed medication regimen will improve Outcome: Progressing   Problem: Safety: Goal: Ability to redirect hostility and anger into socially appropriate behaviors will improve Outcome: Progressing Goal: Ability to remain free from injury will improve Outcome: Progressing   Problem: Safety: Goal: Periods of time without injury will increase Outcome: Progressing   Problem: Safety: Goal: Ability to disclose and discuss suicidal ideas will improve Outcome: Progressing   Problem: Safety: Goal: Ability to remain free from injury will improve Outcome: Progressing

## 2018-07-05 NOTE — Progress Notes (Signed)
D- Patient alert and oriented. Patient presents in a pleasant mood on assessment with some complaints of "heartburn" in which she requested medication from this writer. Patient also stated that she hasn't had a bowel movement for about a week, but this is normal for her, and she stated to this writer that she isn't experiencing any signs/symptoms of constipation. This Clinical research associatewriter notified MD. Patient endorses AVH stating to this writer that she sees "spiders, little people, and family members". Patient also states that she hears voices telling her "they'll kill me, so I left from [place], I can't sleep". Patient rates her depression/anxiety a "10/10" stating that she wants "them to stop touching my emotions", but can not state why she's anxious. Patient denies SI, HI, and pain at this time. Patient had no stated goals for today.  A- Scheduled medications administered to patient, per MD orders. Support and encouragement provided.  Routine safety checks conducted every 15 minutes.  Patient informed to notify staff with problems or concerns.  R- No adverse drug reactions noted. Patient contracts for safety at this time. Patient compliant with medications and treatment plan. Patient receptive, calm, and cooperative. Patient interacts well with others on the unit.  Patient remains safe at this time.

## 2018-07-06 ENCOUNTER — Inpatient Hospital Stay: Payer: Medicare Other

## 2018-07-06 LAB — GLUCOSE, CAPILLARY
Glucose-Capillary: 96 mg/dL (ref 70–99)
Glucose-Capillary: 101 mg/dL — ABNORMAL HIGH (ref 70–99)

## 2018-07-06 NOTE — Plan of Care (Signed)
Voice  of nom auditory hallucination  this shift  . Compliant  with medication given  Working on social and coping skills . No anger or control  or safety  concerns . Denies suicidal  ideations     Problem: Education: Goal: Will be free of psychotic symptoms Outcome: Progressing Goal: Knowledge of the prescribed therapeutic regimen will improve Outcome: Progressing   Problem: Coping: Goal: Coping ability will improve Outcome: Progressing Goal: Will verbalize feelings Outcome: Progressing   Problem: Health Behavior/Discharge Planning: Goal: Compliance with prescribed medication regimen will improve Outcome: Progressing   Problem: Safety: Goal: Ability to redirect hostility and anger into socially appropriate behaviors will improve Outcome: Progressing Goal: Ability to remain free from injury will improve Outcome: Progressing   Problem: Safety: Goal: Periods of time without injury will increase Outcome: Progressing   Problem: Safety: Goal: Ability to disclose and discuss suicidal ideas will improve Outcome: Progressing   Problem: Safety: Goal: Ability to remain free from injury will improve Outcome: Progressing

## 2018-07-06 NOTE — Progress Notes (Signed)
Patient ID: Nicole MimesDarlene Cuevas, female   DOB: [birth year], 57 y.o.   MRN: [id number] PER STATE REGULATIONS 482.30  THIS CHART WAS REVIEWED FOR MEDICAL NECESSITY WITH RESPECT TO THE PATIENT'S ADMISSION/ DURATION OF STAY.  NEXT REVIEW DATE: 07/10/2018 Willa RoughJENNIFER JONES Drexler Maland, RN, BSN CASE MANAGER

## 2018-07-06 NOTE — Progress Notes (Signed)
Recreation Therapy Notes  Date: 07/06/18  Time: 9:30 am  Location: Craft Room  Behavioral response: Appropriate   Intervention Topic: Coping skills  Discussion/Intervention:  Group content on today was focused on coping skills. The group defined what coping skills are and when they can be used. Individuals described how they normally cope with thing and the coping skills they normally use. Patients expressed why it is important to cope with things and how not coping with things can affect you. The group participated in the intervention "My coping box" and made coping boxes while adding coping skills they could use in the future to the box. Clinical Observations/Feedback:  Patient came to group late due to unknown reasons. She was pulled from group by peer support but later returned. Individual was social with peers and staff while participating in the intervention.  Koryn Charlot LRT/CTRS        Kessie Croston 07/06/2018 12:45 PM

## 2018-07-06 NOTE — Progress Notes (Signed)
[facility] MD Progress Note  07/06/2018 2:49 PM Nicole Cuevas  MRN:  [id number] Subjective:  Pt states that she is feeling a little better. She reports voices are quieter. She still sees spiders around her. She states that she has always had VH and they have never gone away. She is still very vague about her discharge plan. She states that "a guy said you guys can find me house." Discussed that we do not find housing unless it is a group home. Seh is adamant she does not want a group home. She states that she wants to stay in the [place] area but does not really have a particular reason why. Seh does not want to go back to her sisters house because "They were touching me with feelings." Pt is tolerating medications well. She is calm on the unit. She appears to be caring for her ADLs. She is fully oriented today to date, time, location and president.  I did speak with her sister, [name] at [phone]. [name] states that she has paranoid schizophrenia. Pt has been living with her recently. The family had no idea where she had gone and did not know she was in [place]. She states that they were about to file a missing persons report. She has been gone since Tuesday in which she disappeared after seeing her psychiatrist. [name] states that patient ran away before and one time ended up in [facility]. She states that usually she becomes paranoid with whoever is her care taker. She noticed that pt has been smoking more and "is addicted to sugar." She has had poor eye contact and stopping conversations in the middle. She has been violating her curfew. Her sister only asks of her to be home before dark. Pt has not appreciated her sister giving her rules. She sates, "She really thought I was coming down on her."  Her sister feels ok if she were to go to a boarding home but wishes she would choose one closer to [place] because pt has no family around here. She was in shelters in the past but that did not work out  and "She ended up in [place]." Her family would like to pick her up at discharge if patient agrees. Pt is her own guardian.   Principal Problem: Schizophrenia (HCC) Diagnosis: Principal Problem:   Schizophrenia (HCC)  Total Time spent with patient: 30 minutes  Past Psychiatric History: See h&p  Past Medical History:  Past Medical History:  Diagnosis Date  . Pre-diabetes     Past Surgical History:  Procedure Laterality Date  . schizophrenia     Family History: History reviewed. No pertinent family history. Family Psychiatric  History: See H&P Social History:  Social History   Substance and Sexual Activity  Alcohol Use Yes  . Alcohol/week: 1.0 standard drinks  . Types: 1 Cans of beer per week     Social History   Substance and Sexual Activity  Drug Use Yes  . Types: Cocaine    Social History   Socioeconomic History  . Marital status: Single    Spouse name: Not on file  . Number of children: Not on file  . Years of education: Not on file  . Highest education level: Not on file  Occupational History  . Not on file  Social Needs  . Financial resource strain: Not on file  . Food insecurity:    Worry: Not on file    Inability: Not on file  . Transportation needs:  Medical: Not on file    Non-medical: Not on file  Tobacco Use  . Smoking status: Current Every Day Smoker    Packs/day: 1.00    Years: 40.00    Pack years: 40.00  . Smokeless tobacco: Never Used  Substance and Sexual Activity  . Alcohol use: Yes    Alcohol/week: 1.0 standard drinks    Types: 1 Cans of beer per week  . Drug use: Yes    Types: Cocaine  . Sexual activity: Not on file  Lifestyle  . Physical activity:    Days per week: Not on file    Minutes per session: Not on file  . Stress: Not on file  Relationships  . Social connections:    Talks on phone: Not on file    Gets together: Not on file    Attends religious service: Not on file    Active member of club or organization: Not on  file    Attends meetings of clubs or organizations: Not on file    Relationship status: Not on file  Other Topics Concern  . Not on file  Social History Narrative  . Not on file   Additional Social History:                         Sleep: Good  Appetite:  Good  Current Medications: Current Facility-Administered Medications  Medication Dose Route Frequency Provider Last Rate Last Dose  . acetaminophen (TYLENOL) tablet 650 mg  650 mg Oral Q6H PRN Clapacs, John T, MD      . alum & mag hydroxide-simeth (MAALOX/MYLANTA) 200-200-20 MG/5ML suspension 30 mL  30 mL Oral Q4H PRN Clapacs, Jackquline DenmarkJohn T, MD   30 mL at 07/05/18 1432  . escitalopram (LEXAPRO) tablet 20 mg  20 mg Oral Daily Clapacs, John T, MD   20 mg at 07/06/18 0816  . hydrOXYzine (ATARAX/VISTARIL) tablet 25 mg  25 mg Oral TID PRN Clapacs, Jackquline DenmarkJohn T, MD   25 mg at 07/03/18 2207  . magnesium hydroxide (MILK OF MAGNESIA) suspension 30 mL  30 mL Oral Daily PRN Clapacs, John T, MD      . metFORMIN (GLUCOPHAGE) tablet 500 mg  500 mg Oral BID WC He, Jun, MD   500 mg at 07/06/18 0816  . nicotine (NICODERM CQ - dosed in mg/24 hours) patch 14 mg  14 mg Transdermal Daily Clapacs, Jackquline DenmarkJohn T, MD   14 mg at 07/06/18 0819  . OLANZapine (ZYPREXA) tablet 10 mg  10 mg Oral q morning - 10a He, Jun, MD   10 mg at 07/06/18 0816  . OLANZapine (ZYPREXA) tablet 20 mg  20 mg Oral QHS He, Jun, MD   20 mg at 07/05/18 2109  . traZODone (DESYREL) tablet 50 mg  50 mg Oral QHS PRN Srihitha Tagliaferri, Ileene HutchinsonHolly R, MD   50 mg at 07/03/18 2207    Lab Results:  Results for orders placed or performed during the hospital encounter of 07/02/18 (from the past 48 hour(s))  Glucose, capillary     Status: Abnormal   Collection Time: 07/05/18  5:20 PM  Result Value Ref Range   Glucose-Capillary 119 (H) 70 - 99 mg/dL  Glucose, capillary     Status: None   Collection Time: 07/06/18  6:19 AM  Result Value Ref Range   Glucose-Capillary 96 70 - 99 mg/dL    Blood Alcohol level:  Lab  Results  Component Value Date   ETH <10 07/01/2018  Metabolic Disorder Labs: Lab Results  Component Value Date   HGBA1C 6.3 (H) 07/01/2018   MPG 134.11 07/01/2018   No results found for: PROLACTIN Lab Results  Component Value Date   CHOL 189 07/01/2018   TRIG 121 07/01/2018   HDL 68 07/01/2018   CHOLHDL 2.8 07/01/2018   VLDL 24 07/01/2018   LDLCALC 97 07/01/2018    Physical Findings: AIMS: Facial and Oral Movements Muscles of Facial Expression: Moderate Lips and Perioral Area: Moderate Jaw: Minimal Tongue: Minimal,Extremity Movements Upper (arms, wrists, hands, fingers): Moderate Lower (legs, knees, ankles, toes): None, normal, Trunk Movements Neck, shoulders, hips: None, normal, Overall Severity Severity of abnormal movements (highest score from questions above): Moderate Incapacitation due to abnormal movements: Minimal Patient's awareness of abnormal movements (rate only patient's report): Aware, no distress, Dental Status Current problems with teeth and/or dentures?: Yes Does patient usually wear dentures?: Yes  CIWA:    COWS:     Musculoskeletal: Strength & Muscle Tone: within normal limits Gait & Station: normal Patient leans: N/A  Psychiatric Specialty Exam: Physical Exam  Nursing note and vitals reviewed.   Review of Systems  All other systems reviewed and are negative.   Blood pressure 127/83, pulse 91, temperature 97.8 F (36.6 C), temperature source Oral, resp. rate 18, height 5\' 3"  (1.6 m), weight 100.2 kg, SpO2 95 %.Body mass index is 39.15 kg/m.  General Appearance: Casual  Eye Contact:  Minimal  Speech:  Clear and Coherent  Volume:  Normal  Mood:  Euthymic  Affect:  Appropriate  Thought Process:  Disorganized  Orientation:  Full (Time, Place, and Person)  Thought Content:  Illogical  Suicidal Thoughts:  No  Homicidal Thoughts:  No  Memory:  Recent;   Poor  Judgement:  Impaired  Insight:  Lacking  Psychomotor Activity:  Normal   Concentration:  Concentration: Poor  Recall:  Poor  Fund of Knowledge:  Fair  Language:  Fair  Akathisia:  No      Assets:  Resilience  ADL's:  Intact  Cognition:  Impaired,  Mild  Sleep:  Number of Hours: 7.15     Treatment Plan Summary: 57 yo female with long history of schizophrenia admitted due to voices and delusions. She reports improvement in voices. She is still very vague with symptoms. She has no real clear plan of why she wants to stay in this area but not willing to go back to her sisters house. She does get SSI checks monthly so she can call boarding homes and [facility] an option. Shelter not a good option for her as she would likely decompensate rapidly.   Plan:  Schizophrenia -Continue Zyprexa 10 mg qam and 20 mg qhs. Possibly has fixed delusions -Continue LExapro 20 mg daily -CT head to rule out organic brain pathology due to [facility]. No acute abnormalites  DM -Continue Metformin 500 mg BID  Dispo -I spoke with her sister today as she had no idea where pt was. D/c to boarding home or possible [facility]  Haskell Riling, MD 07/06/2018, 2:49 PM

## 2018-07-06 NOTE — Progress Notes (Signed)
D: Patient stated slept good last night .Stated appetite is good and energy level  Is normal. Stated concentration is good . Stated on Depression scale , hopeless and anxiety .( low 0-10 high) Denies suicidal  homicidal ideations  .  No auditory hallucinations  No pain concerns . Appropriate ADL'S. Interacting with peers and staff. Voice  of  auditory hallucination  this shift  Unable to understand them  . Compliant  with medication given  Working on social and coping skills . No anger or control  or safety  concerns . Denies suicidal  ideations    A: Encourage patient participation with unit programming . Instruction  Given on  Medication , verbalize understanding.  R: Voice no other concerns. Staff continue to monitor

## 2018-07-07 LAB — GLUCOSE, CAPILLARY
GLUCOSE-CAPILLARY: 158 mg/dL — AB (ref 70–99)
GLUCOSE-CAPILLARY: 131 mg/dL — AB (ref 70–99)
GLUCOSE-CAPILLARY: 152 mg/dL — AB (ref 70–99)

## 2018-07-07 NOTE — Progress Notes (Signed)
[facility] MD Progress Note  07/07/2018 2:00 PM Nicole Cuevas  MRN:  [id number] Subjective:  Pt states that today she is doing "so-so." She states that she still hears voices "telling me the same old stuff." She is not able to elaborate. She states that she sees VH of "pictures of my family." She states that she is sleeping better and eating well. She is not observed responding to internal stimuli. She is very vague with all symptoms.  She is out of her room most of the day and interacting well with others. She is taking care of herself. She is fully oriented today. She states that she still wants to try to find her own apartment but is willing to look for a boarding house first. She asked for a list of boarding houses to call. I discussed with her taht I spoke with her sister and she wants to pick her up on discharge and she states that she feels okay with this.  Principal Problem: Schizophrenia (HCC) Diagnosis: Principal Problem:   Schizophrenia (HCC)  Total Time spent with patient: 20 minutes  Past Psychiatric History: See h&P  Past Medical History:  Past Medical History:  Diagnosis Date  . Pre-diabetes     Past Surgical History:  Procedure Laterality Date  . schizophrenia     Family History: History reviewed. No pertinent family history. Family Psychiatric  History: See H&P Social History:  Social History   Substance and Sexual Activity  Alcohol Use Yes  . Alcohol/week: 1.0 standard drinks  . Types: 1 Cans of beer per week     Social History   Substance and Sexual Activity  Drug Use Yes  . Types: Cocaine    Social History   Socioeconomic History  . Marital status: Single    Spouse name: Not on file  . Number of children: Not on file  . Years of education: Not on file  . Highest education level: Not on file  Occupational History  . Not on file  Social Needs  . Financial resource strain: Not on file  . Food insecurity:    Worry: Not on file    Inability: Not on file   . Transportation needs:    Medical: Not on file    Non-medical: Not on file  Tobacco Use  . Smoking status: Current Every Day Smoker    Packs/day: 1.00    Years: 40.00    Pack years: 40.00  . Smokeless tobacco: Never Used  Substance and Sexual Activity  . Alcohol use: Yes    Alcohol/week: 1.0 standard drinks    Types: 1 Cans of beer per week  . Drug use: Yes    Types: Cocaine  . Sexual activity: Not on file  Lifestyle  . Physical activity:    Days per week: Not on file    Minutes per session: Not on file  . Stress: Not on file  Relationships  . Social connections:    Talks on phone: Not on file    Gets together: Not on file    Attends religious service: Not on file    Active member of club or organization: Not on file    Attends meetings of clubs or organizations: Not on file    Relationship status: Not on file  Other Topics Concern  . Not on file  Social History Narrative  . Not on file   Additional Social History:  Sleep: Good  Appetite:  Good  Current Medications: Current Facility-Administered Medications  Medication Dose Route Frequency Provider Last Rate Last Dose  . acetaminophen (TYLENOL) tablet 650 mg  650 mg Oral Q6H PRN Clapacs, Jackquline Denmark, MD   650 mg at 07/06/18 1623  . alum & mag hydroxide-simeth (MAALOX/MYLANTA) 200-200-20 MG/5ML suspension 30 mL  30 mL Oral Q4H PRN Clapacs, John T, MD   30 mL at 07/07/18 1058  . escitalopram (LEXAPRO) tablet 20 mg  20 mg Oral Daily Clapacs, Jackquline Denmark, MD   20 mg at 07/07/18 0909  . hydrOXYzine (ATARAX/VISTARIL) tablet 25 mg  25 mg Oral TID PRN Clapacs, Jackquline Denmark, MD   25 mg at 07/03/18 2207  . magnesium hydroxide (MILK OF MAGNESIA) suspension 30 mL  30 mL Oral Daily PRN Clapacs, John T, MD      . metFORMIN (GLUCOPHAGE) tablet 500 mg  500 mg Oral BID WC He, Jun, MD   500 mg at 07/07/18 0909  . nicotine (NICODERM CQ - dosed in mg/24 hours) patch 14 mg  14 mg Transdermal Daily Clapacs, Jackquline Denmark, MD    14 mg at 07/06/18 0819  . OLANZapine (ZYPREXA) tablet 10 mg  10 mg Oral q morning - 10a He, Jun, MD   10 mg at 07/07/18 0909  . OLANZapine (ZYPREXA) tablet 20 mg  20 mg Oral QHS He, Jun, MD   20 mg at 07/06/18 2126  . traZODone (DESYREL) tablet 50 mg  50 mg Oral QHS PRN Haskell Riling, MD   50 mg at 07/03/18 2207    Lab Results:  Results for orders placed or performed during the hospital encounter of 07/02/18 (from the past 48 hour(s))  Glucose, capillary     Status: Abnormal   Collection Time: 07/05/18  5:20 PM  Result Value Ref Range   Glucose-Capillary 119 (H) 70 - 99 mg/dL  Glucose, capillary     Status: None   Collection Time: 07/06/18  6:19 AM  Result Value Ref Range   Glucose-Capillary 96 70 - 99 mg/dL  Glucose, capillary     Status: Abnormal   Collection Time: 07/06/18  4:28 PM  Result Value Ref Range   Glucose-Capillary 101 (H) 70 - 99 mg/dL  Glucose, capillary     Status: Abnormal   Collection Time: 07/07/18  7:03 AM  Result Value Ref Range   Glucose-Capillary 158 (H) 70 - 99 mg/dL   Comment 1 Notify RN   Glucose, capillary     Status: Abnormal   Collection Time: 07/07/18 12:59 PM  Result Value Ref Range   Glucose-Capillary 131 (H) 70 - 99 mg/dL    Blood Alcohol level:  Lab Results  Component Value Date   ETH <10 07/01/2018    Metabolic Disorder Labs: Lab Results  Component Value Date   HGBA1C 6.3 (H) 07/01/2018   MPG 134.11 07/01/2018   No results found for: PROLACTIN Lab Results  Component Value Date   CHOL 189 07/01/2018   TRIG 121 07/01/2018   HDL 68 07/01/2018   CHOLHDL 2.8 07/01/2018   VLDL 24 07/01/2018   LDLCALC 97 07/01/2018    Physical Findings: AIMS: Facial and Oral Movements Muscles of Facial Expression: Moderate Lips and Perioral Area: Moderate Jaw: Minimal Tongue: Minimal,Extremity Movements Upper (arms, wrists, hands, fingers): Moderate Lower (legs, knees, ankles, toes): None, normal, Trunk Movements Neck, shoulders, hips: None,  normal, Overall Severity Severity of abnormal movements (highest score from questions above): Moderate Incapacitation due to abnormal movements:  Minimal Patient's awareness of abnormal movements (rate only patient's report): Aware, no distress, Dental Status Current problems with teeth and/or dentures?: Yes Does patient usually wear dentures?: Yes  CIWA:    COWS:     Musculoskeletal: Strength & Muscle Tone: within normal limits Gait & Station: normal Patient leans: N/A  Psychiatric Specialty Exam: Physical Exam  Nursing note and vitals reviewed.   Review of Systems  All other systems reviewed and are negative.   Blood pressure 120/76, pulse 85, temperature 98.6 F (37 C), temperature source Oral, resp. rate 18, height 5\' 3"  (1.6 m), weight 100.2 kg, SpO2 95 %.Body mass index is 39.15 kg/m.  General Appearance: Casual  Eye Contact:  Fair  Speech:  Clear and Coherent  Volume:  Normal  Mood:  Depressed  Affect:  Constricted  Thought Process:  Coherent and Goal Directed  Orientation:  Full (Time, Place, and Person)  Thought Content:  Hallucinations: Auditory Visual  Suicidal Thoughts:  No  Homicidal Thoughts:  No  Memory:  Immediate;   Fair  Judgement:  Impaired  Insight:  Lacking  Psychomotor Activity:  Normal  Concentration:  Concentration: Fair  Recall:  [employer]Fair  Fund of Knowledge:  Fair  Language:  Fair  Akathisia:  No      Assets:  Resilience  ADL's:  Intact  Cognition:  WNL  Sleep:  Number of Hours: 6     Treatment Plan Summary: 57 yo female with long history of schizophrenia admitted due to voices and delusions. She continues to have AH and VH but is extremely vague with any of these symptosm. She is coming out of her room more. She still wants a boarding house on discharge. She was given list of boarding homes to call while here. Shelter is not a good option as she would likely decompensate rapidly.   Plan:  Schizophrenia -Continue Zyprexa 10 mg qam and 20  mg qhs -Continue Lexapro 20 mg daily -CT head negative  DM -Continue Metformin 500 mg BID  Dispo - I left message with sister to coordinate plan with her.  Haskell RilingHolly R Shalaya Swailes, MD 07/07/2018, 2:00 PM

## 2018-07-07 NOTE — [facility] Group Notes (Signed)
[facility] Group Notes:  (Nursing/MHT/Case Management/Adjunct)  Date:  07/07/2018  Time:  9:36 PM  Type of Therapy:  Group Therapy  Participation Level:  Active  Participation Quality:  Appropriate  Affect:  Appropriate  Cognitive:  Appropriate  Insight:  Appropriate  Engagement in Group:  Engaged  Modes of Intervention:  Discussion  Summary of Progress/Problems: Nicole Cuevas stated her goal was not to eat so much. Nicole Cuevas stated she did not want to gain weight while on the unit. Nicole Cuevas stated the food was delicious. MHT reviewed rules and expectations of the unit. MHT informed patients of visiting hours and limit of visitors at one time. MHT informed patient's visitors were not allowed to bring in food and up to 5 sets of clothing. MHT encouraged patients not to give out private information and not to use last name on unit. MHT encouraged patients to attend groups throughout the day. MHT informed patients of phone hours and the limit of long-distance phone calls. MHT informed patients they would have a discharge plan with follow up directions. MHT encouraged patients to follow up with recommended treatment. MHT processed with patients about the importance of taking medications as prescribed.  Jinger NeighborsKeith D Zay Yeargan 07/07/2018, 9:36 PM

## 2018-07-07 NOTE — Plan of Care (Signed)
Patient denies SI/HI, however, she does endorse AVH stating to this writer that she sees "spiders" and hears voices, but did not voice to this Clinical research associatewriter what she's hearing. Patient rates her depression/anxiety a "10/10" stating that she's "jittery" and "anxious to get rid of that feeling". Patient has attended and participated in unit groups without any issues at this time. Patient has been in compliance with her prescribed medication regimen and has not voiced any further questions/concerns. Patient has the ability to redirect her anger and frustrations into socially appropriate behaviors. Patient has been free from injury and remains safe on the unit at this time.  Problem: Education: Goal: Will be free of psychotic symptoms Outcome: Progressing Goal: Knowledge of the prescribed therapeutic regimen will improve Outcome: Progressing   Problem: Coping: Goal: Coping ability will improve Outcome: Progressing Goal: Will verbalize feelings Outcome: Progressing   Problem: Health Behavior/Discharge Planning: Goal: Compliance with prescribed medication regimen will improve Outcome: Progressing   Problem: Safety: Goal: Ability to redirect hostility and anger into socially appropriate behaviors will improve Outcome: Progressing Goal: Ability to remain free from injury will improve Outcome: Progressing   Problem: Safety: Goal: Periods of time without injury will increase Outcome: Progressing   Problem: Safety: Goal: Ability to disclose and discuss suicidal ideas will improve Outcome: Progressing   Problem: Safety: Goal: Ability to remain free from injury will improve Outcome: Progressing

## 2018-07-07 NOTE — Plan of Care (Signed)
Complaint with medications. Denies si/hi, contracts for safety. Continues to report AVH that are disturbing to her.    Problem: Health Behavior/Discharge Planning: Goal: Compliance with prescribed medication regimen will improve Outcome: Progressing   Problem: Safety: Goal: Ability to remain free from injury will improve Outcome: Progressing   Problem: Safety: Goal: Periods of time without injury will increase Outcome: Progressing   Problem: Safety: Goal: Ability to disclose and discuss suicidal ideas will improve Outcome: Progressing   Problem: Safety: Goal: Ability to remain free from injury will improve Outcome: Progressing   Problem: Education: Goal: Will be free of psychotic symptoms Outcome: Not Progressing

## 2018-07-07 NOTE — Progress Notes (Signed)
D: Pt denies SI/HI, verbally is able to contract for safety. Pt is pleasant and cooperative, engages appropriately during this writer's assessment. Pt. Continues to report AVH with no change in the reported hallucinations. Pt. has no Complaints.  Patient Interactions minimal. Pt. Continues to frequently isolate and withdraw to her room a majority of the time. Pt. Does come up for snack time and takes her medication without problems. Reports depression and anxiety, "a little". No behavioral concerns to report.   A: Q x 15 minute observation checks were completed for safety. Patient was provided with education, but needs reinforcement.  Patient was given/offered medications per orders. Patient  was encourage to attend groups, participate in unit activities and continue with plan of care. Pt. Chart and plans of care reviewed. Pt. Given support and encouragement.   R: Patient is complaint with medication and unit procedures with direction and encouragement. Blood sugars monitored per MD orders.              Precautionary checks every 15 minutes for safety maintained, room free of safety hazards, patient sustains no injury or falls during this shift. Will endorse care to next shift.

## 2018-07-07 NOTE — Progress Notes (Signed)
D- Patient alert and oriented. Patient presents in a pleasant mood on assessment stating she slept "alright" last night and the only complaint that she had is that her stomach is feeling jittery "I'm sick on the inside". Patient states that nothing helps. Patient endorses AVH stating that she is seeing spiders, but did not state to this writer what the voices are saying to her. Patient denies SI, HI, and pain at this time. Patient's goal for today is to "feel better".  A- Scheduled medications administered to patient, per MD orders. Support and encouragement provided.  Routine safety checks conducted every 15 minutes.  Patient informed to notify staff with problems or concerns.  R- No adverse drug reactions noted. Patient contracts for safety at this time. Patient compliant with medications and treatment plan. Patient receptive, calm, and cooperative. Patient interacts well with others on the unit.  Patient remains safe at this time.

## 2018-07-07 NOTE — Progress Notes (Signed)
Recreation Therapy Notes  Date: 07/07/18  Time: 9:30 am  Location: Craft Room  Behavioral response: Appropriate   Intervention Topic: Goals  Discussion/Intervention:  Group content on today was focused on goals. Patients described what goals are and how they define goals. Individuals expressed how they go about setting goals and reaching them. The group identified how important goals are and if they make short term goals to reach long term goals. Patients described how many goals they work on at a time and what affects them not reaching their goal. Individuals described how much time they put into planning and obtaining their goals. The group participated in the intervention "My Goal Board" and made personal goal boards to help them achieve their goal. Clinical Observations/Feedback:  Patient came to group and stated a goal she has is not to do drugs. Individual was social with peers and staff while participating in the intervention.  Nicole Cuevas LRT/CTRS        Nicole Cuevas 07/07/2018 12:56 PM

## 2018-07-08 LAB — GLUCOSE, CAPILLARY
GLUCOSE-CAPILLARY: 85 mg/dL (ref 70–99)
Glucose-Capillary: 153 mg/dL — ABNORMAL HIGH (ref 70–99)
Glucose-Capillary: 86 mg/dL (ref 70–99)
Glucose-Capillary: 100 mg/dL — ABNORMAL HIGH (ref 70–99)

## 2018-07-08 NOTE — Progress Notes (Signed)
D: Patient stated slept good last night .Stated appetite is good and energy level low  Stated concentration poor. Stated on Depression scale 8 , hopeless 8 and anxiety 7.( low 0-10 high) Denies suicidal  homicidal ideations  .  No auditory hallucinations  No pain concerns . Appropriate ADL'S. Interacting with peers and staff. Denies suicidal  ideations   Patient  Voice of her goal today "Think Positive " and" Believe  In God"  Voice of no auditory hallucination this shift . Compliant with medication given Working on social and coping skills . No anger or control or safety concerns .    A: Encourage patient participation with unit programming . Instruction  Given on  Medication , verbalize understanding.  R: Voice no other concerns. Staff continue to monitor

## 2018-07-08 NOTE — Progress Notes (Signed)
Recreation Therapy Notes  Date: 11/ 20/19  Time: 9:30 am  Location: Craft Room  Behavioral response: Appropriate   Intervention Topic: Team Work  Discussion/Intervention:  Group content on today was focused on teamwork. The group identified what teamwork is. Individuals described who is a part of their team. Patients expressed why they thought teamwork is important. The group stated reasons why they thought it was easier to work with a Comptrollersmaller/larger team. Individuals discussed some positives and negatives of working with a team. Patients gave examples of past experiences they had while working with a team. The group participated in the intervention "Story in a bag", patients were in groups and were able to test their skill in a team setting.  Clinical Observations/Feedback:  Patient attended group. Jarold Macomber LRT/CTRS        Surie Suchocki 07/08/2018 12:41 PM

## 2018-07-08 NOTE — Progress Notes (Signed)
patient alert and oriented no distress noted affect is flat but brightens upon approach, interacting appropriately with peers and staff, patient makes appropriate eye contact, rated depression a 4/10  Scale ( 0 low and 10 high) complaint with medication will continue to monitor closely

## 2018-07-08 NOTE — Progress Notes (Signed)
[facility]  07/08/2018 2:09 PM Consuela MimesDarlene Wrightson  MRN:  [id number] Subjective:  Pt states that she is feeling a little bit better. She continues to report AH but states they are starting to improve and quieter. She continues to see "spider and things" but states these never go away. She reports sleeping much better and "the medications are great. She does have brighter affect. She is more rational in her plans to have her sister pick her up when she discharges. She does want to eventually get her own apartment.   Principal Problem: Schizophrenia (HCC) Diagnosis: Principal Problem:   Schizophrenia (HCC)  Total Time spent with patient: 20 minutes  Past Psychiatric History: See H&P  Past Medical History:  Past Medical History:  Diagnosis Date  . Pre-diabetes     Past Surgical History:  Procedure Laterality Date  . schizophrenia     Family History: History reviewed. No pertinent family history. Family Psychiatric  History: See H&P Social History:  Social History   Substance and Sexual Activity  Alcohol Use Yes  . Alcohol/week: 1.0 standard drinks  . Types: 1 Cans of beer per week     Social History   Substance and Sexual Activity  Drug Use Yes  . Types: Cocaine    Social History   Socioeconomic History  . Marital status: Single    Spouse name: Not on file  . Number of children: Not on file  . Years of education: Not on file  . Highest education level: Not on file  Occupational History  . Not on file  Social Needs  . Financial resource strain: Not on file  . Food insecurity:    Worry: Not on file    Inability: Not on file  . Transportation needs:    Medical: Not on file    Non-medical: Not on file  Tobacco Use  . Smoking status: Current Every Day Smoker    Packs/day: 1.00    Years: 40.00    Pack years: 40.00  . Smokeless tobacco: Never Used  Substance and Sexual Activity  . Alcohol use: Yes    Alcohol/week: 1.0 standard drinks    Types: 1 Cans of  beer per week  . Drug use: Yes    Types: Cocaine  . Sexual activity: Not on file  Lifestyle  . Physical activity:    Days per week: Not on file    Minutes per session: Not on file  . Stress: Not on file  Relationships  . Social connections:    Talks on phone: Not on file    Gets together: Not on file    Attends religious service: Not on file    Active member of club or organization: Not on file    Attends meetings of clubs or organizations: Not on file    Relationship status: Not on file  Other Topics Concern  . Not on file  Social History Narrative  . Not on file   Additional Social History:                         Sleep: Fair  Appetite:  Fair  Current Medications: Current Facility-Administered Medications  Medication Dose Route Frequency Provider Last Rate Last Dose  . acetaminophen (TYLENOL) tablet 650 mg  650 mg Oral Q6H PRN Clapacs, Jackquline DenmarkJohn T, MD   650 mg at 07/06/18 1623  . alum & mag hydroxide-simeth (MAALOX/MYLANTA) 200-200-20 MG/5ML suspension 30 mL  30 mL Oral Q4H  PRN Clapacs, Jackquline Denmark, MD   30 mL at 07/07/18 1058  . escitalopram (LEXAPRO) tablet 20 mg  20 mg Oral Daily Clapacs, John T, MD   20 mg at 07/08/18 0818  . hydrOXYzine (ATARAX/VISTARIL) tablet 25 mg  25 mg Oral TID PRN Clapacs, Jackquline Denmark, MD   25 mg at 07/03/18 2207  . magnesium hydroxide (MILK OF MAGNESIA) suspension 30 mL  30 mL Oral Daily PRN Clapacs, John T, MD      . metFORMIN (GLUCOPHAGE) tablet 500 mg  500 mg Oral BID WC He, Jun, MD   500 mg at 07/08/18 0818  . nicotine (NICODERM CQ - dosed in mg/24 hours) patch 14 mg  14 mg Transdermal Daily Clapacs, Jackquline Denmark, MD   14 mg at 07/08/18 0821  . OLANZapine (ZYPREXA) tablet 10 mg  10 mg Oral q morning - 10a He, Jun, MD   10 mg at 07/08/18 1610  . OLANZapine (ZYPREXA) tablet 20 mg  20 mg Oral QHS He, Jun, MD   20 mg at 07/07/18 2200  . traZODone (DESYREL) tablet 50 mg  50 mg Oral QHS PRN Elinora Weigand, Ileene Hutchinson, MD   50 mg at 07/07/18 2200    Lab Results:   Results for orders placed or performed during the hospital encounter of 07/02/18 (from the past 48 hour(s))  Glucose, capillary     Status: Abnormal   Collection Time: 07/06/18  4:28 PM  Result Value Ref Range   Glucose-Capillary 101 (H) 70 - 99 mg/dL  Glucose, capillary     Status: Abnormal   Collection Time: 07/07/18  7:03 AM  Result Value Ref Range   Glucose-Capillary 158 (H) 70 - 99 mg/dL   Comment 1 Notify RN   Glucose, capillary     Status: Abnormal   Collection Time: 07/07/18 12:59 PM  Result Value Ref Range   Glucose-Capillary 131 (H) 70 - 99 mg/dL  Glucose, capillary     Status: Abnormal   Collection Time: 07/07/18  4:32 PM  Result Value Ref Range   Glucose-Capillary 152 (H) 70 - 99 mg/dL   Comment 1 Notify RN   Glucose, capillary     Status: Abnormal   Collection Time: 07/08/18  7:28 AM  Result Value Ref Range   Glucose-Capillary 153 (H) 70 - 99 mg/dL  Glucose, capillary     Status: None   Collection Time: 07/08/18 11:31 AM  Result Value Ref Range   Glucose-Capillary 86 70 - 99 mg/dL    Blood Alcohol level:  Lab Results  Component Value Date   ETH <10 07/01/2018    Metabolic Disorder Labs: Lab Results  Component Value Date   HGBA1C 6.3 (H) 07/01/2018   MPG 134.11 07/01/2018   No results found for: PROLACTIN Lab Results  Component Value Date   CHOL 189 07/01/2018   TRIG 121 07/01/2018   HDL 68 07/01/2018   CHOLHDL 2.8 07/01/2018   VLDL 24 07/01/2018   LDLCALC 97 07/01/2018    Physical Findings: AIMS: Facial and Oral Movements Muscles of Facial Expression: Moderate Lips and Perioral Area: Moderate Jaw: Minimal Tongue: Minimal,Extremity Movements Upper (arms, wrists, hands, fingers): Moderate Lower (legs, knees, ankles, toes): None, normal, Trunk Movements Neck, shoulders, hips: None, normal, Overall Severity Severity of abnormal movements (highest score from questions above): Moderate Incapacitation due to abnormal movements: Minimal Patient's  awareness of abnormal movements (rate only patient's report): Aware, no distress, Dental Status Current problems with teeth and/or dentures?: Yes Does patient usually wear  dentures?: Yes  CIWA:    COWS:     Musculoskeletal: Strength & Muscle Tone: within normal limits Gait & Station: normal Patient leans: N/A  Psychiatric Specialty Exam: Physical Exam  Nursing note and vitals reviewed.   Review of Systems  All other systems reviewed and are negative.   Blood pressure 120/71, pulse 64, temperature 98.2 F (36.8 C), temperature source Oral, resp. rate 18, height 5\' 3"  (1.6 m), weight 100.2 kg, SpO2 97 %.Body mass index is 39.15 kg/m.  General Appearance: Casual  Eye Contact:  Good  Speech:  Clear and Coherent  Volume:  Normal  Mood:  Euthymic  Affect:  Appropriate, brighter today  Thought Process:  Coherent and Goal Directed  Orientation:  Full (Time, Place, and Person)  Thought Content:  Hallucinations: Auditory Visual  Suicidal Thoughts:  No  Homicidal Thoughts:  No  Memory:  Immediate;   Fair  Judgement:  Impaired  Insight:  Lacking  Psychomotor Activity:  Normal  Concentration:  Concentration: Fair  Recall:  [employer] of Knowledge:  Fair  Language:  Fair  Akathisia:  No      Assets:  Resilience  ADL's:  Intact  Cognition:  WNL  Sleep:  Number of Hours: 6.75     Treatment Plan Summary: 57 yo female with long history of schizophrenia admitted due to voices and delusions. She is still very vague but states that the voices are improving. She has less delusional thoughts content during conversations. She is more rational with coming up with a discharge plan. She now tentatively agrees to have her sister pick her up when she is discharged. We are working with her sister on coming up with a safe disposition for patient as a shelter is not an option for her.   Plan:  Schizophrenia -Continue Zyprexa 10 mg qam and 20 mg qhs -Continue Lexapro 20 mg daily -CT head  negative  DM -Continue Metformin 500 mg BID  Dispo -Spoke with her sister yesterday who can pick her up on Saturday only if she is stable by then. Pt has been living with her sister. Pt requires family member to discharge with as she has history of impulsively running to other states when she is alone Haskell Riling, MD 07/08/2018, 2:09 PM

## 2018-07-08 NOTE — Plan of Care (Signed)
Denies suicidal  ideations  Voice  of nom auditory hallucination  this shift  . Compliant  with medication given  Working on social and coping skills . No anger or control  or safety  concerns .    Problem: Education: Goal: Will be free of psychotic symptoms Outcome: Progressing Goal: Knowledge of the prescribed therapeutic regimen will improve Outcome: Progressing   Problem: Coping: Goal: Coping ability will improve Outcome: Progressing Goal: Will verbalize feelings Outcome: Progressing   Problem: Health Behavior/Discharge Planning: Goal: Compliance with prescribed medication regimen will improve Outcome: Progressing   Problem: Safety: Goal: Ability to redirect hostility and anger into socially appropriate behaviors will improve Outcome: Progressing Goal: Ability to remain free from injury will improve Outcome: Progressing   Problem: Safety: Goal: Periods of time without injury will increase Outcome: Progressing   Problem: Safety: Goal: Ability to disclose and discuss suicidal ideas will improve Outcome: Progressing   Problem: Safety: Goal: Ability to remain free from injury will improve Outcome: Progressing

## 2018-07-08 NOTE — Plan of Care (Signed)
  Problem: Education: Goal: Will be free of psychotic symptoms Outcome: Progressing  Patient appears less psychotic  

## 2018-07-09 LAB — GLUCOSE, CAPILLARY
Glucose-Capillary: 101 mg/dL — ABNORMAL HIGH (ref 70–99)
Glucose-Capillary: 76 mg/dL (ref 70–99)
Glucose-Capillary: 102 mg/dL — ABNORMAL HIGH (ref 70–99)

## 2018-07-09 MED ORDER — PROPRANOLOL HCL 20 MG PO TABS
10.0000 mg | ORAL_TABLET | Freq: Two times a day (BID) | ORAL | Status: DC
  Administered 2018-07-09 – 2018-07-11 (×4): 10 mg via ORAL
  Filled 2018-07-09 (×4): qty 1

## 2018-07-09 MED ORDER — PANTOPRAZOLE SODIUM 40 MG PO TBEC
40.0000 mg | DELAYED_RELEASE_TABLET | Freq: Every day | ORAL | Status: DC
  Administered 2018-07-09 – 2018-07-11 (×3): 40 mg via ORAL
  Filled 2018-07-09 (×2): qty 1

## 2018-07-09 NOTE — Progress Notes (Signed)
D: Patient stated slept good last night .Stated appetite is good and energy level  Is normal. Stated concentration is good . Stated on Depression scale  0, hopeless 0 and anxiety 0 .( low 0-10 high) Denies suicidal  homicidal ideations  .  No auditory hallucinations  No pain concerns . Appropriate ADL'S. Interacting with peers and staff. Working on Air cabin crewsocial and coping skills . Denies suicidal  ideations  Voice  of nom auditory hallucination  this shift  . Compliant  with medication given   No anger or control  or safety  concerns .  Aware of discharge  Tomorrow back to sister   A: Encourage patient participation with unit programming . Instruction  Given on  Medication , verbalize understanding.  R: Voice no other concerns. Staff continue to monitor

## 2018-07-09 NOTE — Progress Notes (Signed)
Patient alert and oriented x 4, affect is flat but brightens upon approach, no distress noted, she denies SI/HI/AVH, interacting appropriately with peers and staff, visible in the milieu, She attended evening wrap up group and made appropriate eye contact. Patient  rated depression a 3/10  Scale ( 0 low - 10 high).  Patient is complaint with medication, and receptive to treatment plan, and she is aware of her discharge on Saturday. 15 minutes safety checks maintined will continue to monitor closely

## 2018-07-09 NOTE — Plan of Care (Signed)
  Problem: Education: Goal: Will be free of psychotic symptoms Outcome: Progressing  Patient is free of psychotic symptoms  

## 2018-07-09 NOTE — Plan of Care (Signed)
Working on Air cabin crewsocial and coping skills . Denies suicidal  ideations  Voice  of nom auditory hallucination  this shift  . Compliant  with medication given   No anger or control  or safety  concerns .    Problem: Education: Goal: Will be free of psychotic symptoms Outcome: Progressing Goal: Knowledge of the prescribed therapeutic regimen will improve Outcome: Progressing   Problem: Coping: Goal: Coping ability will improve Outcome: Progressing Goal: Will verbalize feelings Outcome: Progressing   Problem: Health Behavior/Discharge Planning: Goal: Compliance with prescribed medication regimen will improve Outcome: Progressing   Problem: Safety: Goal: Ability to redirect hostility and anger into socially appropriate behaviors will improve Outcome: Progressing Goal: Ability to remain free from injury will improve Outcome: Progressing   Problem: Safety: Goal: Periods of time without injury will increase Outcome: Progressing   Problem: Safety: Goal: Ability to disclose and discuss suicidal ideas will improve Outcome: Progressing   Problem: Safety: Goal: Ability to remain free from injury will improve Outcome: Progressing

## 2018-07-09 NOTE — Progress Notes (Signed)
[facility] MD Progress Note  07/09/2018 2:47 PM Nicole Cuevas  MRN:  [id number] Subjective:  Pt states that she "feels the same." She reports vague hallucinations but is unable to describe them. She denies command hallucinations. She states that she feels "jittery inside and it makes me sad." She states that she still has VH of 'Spiders" and these have never gone away. She is organized in her thinking. She is calm and appropriate on the unit. She denies feeling suicidal or homicidal. She is caring for her ADLs well.   Principal Problem: Schizophrenia (HCC) Diagnosis: Principal Problem:   Schizophrenia (HCC)  Total Time spent with patient: 20 minutes  Past Psychiatric History: SEeh &P  Past Medical History:  Past Medical History:  Diagnosis Date  . Pre-diabetes     Past Surgical History:  Procedure Laterality Date  . schizophrenia     Family History: History reviewed. No pertinent family history. Family Psychiatric  History: See H&P Social History:  Social History   Substance and Sexual Activity  Alcohol Use Yes  . Alcohol/week: 1.0 standard drinks  . Types: 1 Cans of beer per week     Social History   Substance and Sexual Activity  Drug Use Yes  . Types: Cocaine    Social History   Socioeconomic History  . Marital status: Single    Spouse name: Not on file  . Number of children: Not on file  . Years of education: Not on file  . Highest education level: Not on file  Occupational History  . Not on file  Social Needs  . Financial resource strain: Not on file  . Food insecurity:    Worry: Not on file    Inability: Not on file  . Transportation needs:    Medical: Not on file    Non-medical: Not on file  Tobacco Use  . Smoking status: Current Every Day Smoker    Packs/day: 1.00    Years: 40.00    Pack years: 40.00  . Smokeless tobacco: Never Used  Substance and Sexual Activity  . Alcohol use: Yes    Alcohol/week: 1.0 standard drinks    Types: 1 Cans of beer per  week  . Drug use: Yes    Types: Cocaine  . Sexual activity: Not on file  Lifestyle  . Physical activity:    Days per week: Not on file    Minutes per session: Not on file  . Stress: Not on file  Relationships  . Social connections:    Talks on phone: Not on file    Gets together: Not on file    Attends religious service: Not on file    Active member of club or organization: Not on file    Attends meetings of clubs or organizations: Not on file    Relationship status: Not on file  Other Topics Concern  . Not on file  Social History Narrative  . Not on file   Additional Social History:                         Sleep: Good  Appetite:  Good  Current Medications: Current Facility-Administered Medications  Medication Dose Route Frequency Provider Last Rate Last Dose  . acetaminophen (TYLENOL) tablet 650 mg  650 mg Oral Q6H PRN Clapacs, Jackquline Denmark, MD   650 mg at 07/06/18 1623  . alum & mag hydroxide-simeth (MAALOX/MYLANTA) 200-200-20 MG/5ML suspension 30 mL  30 mL Oral Q4H PRN Clapacs, John  T, MD   30 mL at 07/08/18 1459  . escitalopram (LEXAPRO) tablet 20 mg  20 mg Oral Daily Clapacs, John T, MD   20 mg at 07/09/18 0815  . hydrOXYzine (ATARAX/VISTARIL) tablet 25 mg  25 mg Oral TID PRN Clapacs, Jackquline DenmarkJohn T, MD   25 mg at 07/08/18 2157  . magnesium hydroxide (MILK OF MAGNESIA) suspension 30 mL  30 mL Oral Daily PRN Clapacs, John T, MD      . metFORMIN (GLUCOPHAGE) tablet 500 mg  500 mg Oral BID WC He, Jun, MD   500 mg at 07/09/18 0815  . nicotine (NICODERM CQ - dosed in mg/24 hours) patch 14 mg  14 mg Transdermal Daily Clapacs, Jackquline DenmarkJohn T, MD   14 mg at 07/09/18 16100828  . OLANZapine (ZYPREXA) tablet 10 mg  10 mg Oral q morning - 10a He, Jun, MD   10 mg at 07/09/18 0815  . OLANZapine (ZYPREXA) tablet 20 mg  20 mg Oral QHS He, Jun, MD   20 mg at 07/08/18 2157  . propranolol (INDERAL) tablet 10 mg  10 mg Oral BID Rudy Luhmann, Ileene HutchinsonHolly R, MD      . traZODone (DESYREL) tablet 50 mg  50 mg Oral QHS PRN  Haskell RilingMcNew, Carleen Rhue R, MD   50 mg at 07/08/18 2157    Lab Results:  Results for orders placed or performed during the hospital encounter of 07/02/18 (from the past 48 hour(s))  Glucose, capillary     Status: Abnormal   Collection Time: 07/07/18  4:32 PM  Result Value Ref Range   Glucose-Capillary 152 (H) 70 - 99 mg/dL   Comment 1 Notify RN   Glucose, capillary     Status: Abnormal   Collection Time: 07/08/18  7:28 AM  Result Value Ref Range   Glucose-Capillary 153 (H) 70 - 99 mg/dL  Glucose, capillary     Status: None   Collection Time: 07/08/18 11:31 AM  Result Value Ref Range   Glucose-Capillary 86 70 - 99 mg/dL  Glucose, capillary     Status: None   Collection Time: 07/08/18  4:22 PM  Result Value Ref Range   Glucose-Capillary 85 70 - 99 mg/dL  Glucose, capillary     Status: Abnormal   Collection Time: 07/08/18  8:50 PM  Result Value Ref Range   Glucose-Capillary 100 (H) 70 - 99 mg/dL   Comment 1 Notify RN   Glucose, capillary     Status: Abnormal   Collection Time: 07/09/18  7:16 AM  Result Value Ref Range   Glucose-Capillary 101 (H) 70 - 99 mg/dL  Glucose, capillary     Status: None   Collection Time: 07/09/18 11:25 AM  Result Value Ref Range   Glucose-Capillary 76 70 - 99 mg/dL    Blood Alcohol level:  Lab Results  Component Value Date   ETH <10 07/01/2018    Metabolic Disorder Labs: Lab Results  Component Value Date   HGBA1C 6.3 (H) 07/01/2018   MPG 134.11 07/01/2018   No results found for: PROLACTIN Lab Results  Component Value Date   CHOL 189 07/01/2018   TRIG 121 07/01/2018   HDL 68 07/01/2018   CHOLHDL 2.8 07/01/2018   VLDL 24 07/01/2018   LDLCALC 97 07/01/2018    Physical Findings: AIMS: Facial and Oral Movements Muscles of Facial Expression: Moderate Lips and Perioral Area: Moderate Jaw: Minimal Tongue: Minimal,Extremity Movements Upper (arms, wrists, hands, fingers): Moderate Lower (legs, knees, ankles, toes): None, normal, Trunk  Movements Neck, shoulders,  hips: None, normal, Overall Severity Severity of abnormal movements (highest score from questions above): Moderate Incapacitation due to abnormal movements: Minimal Patient's awareness of abnormal movements (rate only patient's report): Aware, no distress, Dental Status Current problems with teeth and/or dentures?: Yes Does patient usually wear dentures?: Yes  CIWA:    COWS:     Musculoskeletal: Strength & Muscle Tone: within normal limits Gait & Station: normal Patient leans: N/A  Psychiatric Specialty Exam: Physical Exam  Nursing note and vitals reviewed.   Review of Systems  All other systems reviewed and are negative.   Blood pressure 134/71, pulse 73, temperature 98.5 F (36.9 C), resp. rate 20, height 5\' 3"  (1.6 m), weight 100.2 kg, SpO2 94 %.Body mass index is 39.15 kg/m.  General Appearance: Casual  Eye Contact:  Fair  Speech:  Clear and Coherent  Volume:  Normal  Mood:  Euthymic  Affect:  Constricted  Thought Process:  Coherent and Goal Directed  Orientation:  Full (Time, Place, and Person)  Thought Content:  Hallucinations: Auditory and Paranoid Ideation  Suicidal Thoughts:  No  Homicidal Thoughts:  No  Memory:  Immediate;   Fair  Judgement:  Fair  Insight:  Fair  Psychomotor Activity:  Normal  Concentration:  Concentration: Fair  Recall:  [employer] of Knowledge:  Fair  Language:  Fair  Akathisia:  Yes      Assets:  Resilience  ADL's:  Intact  Cognition:  WNL  Sleep:  Number of Hours: 7     Treatment Plan Summary: 57 yo female admitted due to worsening AH and VH. She has been calm on the unit. She continues to report AH and VH but is extremely vague about them. She does not appear psychotic to staff or myself with no delay in respondes or responding to internal stimuli. She does complain of a feeling of jitteriness, possibly describing akathisia. Likely getting close to her baseline.  Plan:  Schizophrenia -Continue  Zyprexa 10 mg qam and 20 mg qhs -Continue LExapro 20 mg daily  Akathisia -Start Propranolol 10 mg BID  DM Continue Metformin 500 mg BID  GERD Protonix  -Spoke with her sister who can pick her up on Saturday only if she is stable by then. Pt has been living with her sister. Pt requires family member to discharge with as she has history of impulsively running to other states when she is alone Haskell Riling, MD 07/09/2018, 2:47 PM

## 2018-07-09 NOTE — [facility] Group Notes (Signed)
LCSW Group Therapy Note   07/09/2018 1:00 pm   Type of Therapy and Topic:  Group Therapy:  Overcoming Obstacles   Participation Level:  Minimal   Description of Group:    In this group patients will be encouraged to explore what they see as obstacles to their own wellness and recovery. They will be guided to discuss their thoughts, feelings, and behaviors related to these obstacles. The group will process together ways to cope with barriers, with attention given to specific choices patients can make. Each patient will be challenged to identify changes they are motivated to make in order to overcome their obstacles. This group will be process-oriented, with patients participating in exploration of their own experiences as well as giving and receiving support and challenge from other group members.   Therapeutic Goals: 1. Patient will identify personal and current obstacles as they relate to admission. 2. Patient will identify barriers that currently interfere with their wellness or overcoming obstacles.  3. Patient will identify feelings, thought process and behaviors related to these barriers. 4. Patient will identify two changes they are willing to make to overcome these obstacles:      Summary of Patient Progress Zahriah participated some in today's group discussion on overcoming obstacles.  Johnna shared that "hearing voices" constantly is a personal obstacle that causes her to have to be admitted in the hospital.     Therapeutic Modalities:   Cognitive Behavioral Therapy Solution Focused Therapy Motivational Interviewing Relapse Prevention Therapy  Alease FrameSonya S Citlali Gautney, LCSW 07/09/2018 3:28 PM

## 2018-07-09 NOTE — Progress Notes (Signed)
Recreation Therapy Notes  Date: 07/09/2018  Time: 9:30 am   Location: Craft room   Behavioral response: N/A   Intervention Topic: Emotions  Discussion/Intervention: Patient did not attend group.   Clinical Observations/Feedback:  Patient did not attend group.   Suzzane Quilter LRT/CTRS        Jaretssi Kraker 07/09/2018 10:41 AM

## 2018-07-10 LAB — GLUCOSE, CAPILLARY
GLUCOSE-CAPILLARY: 90 mg/dL (ref 70–99)
GLUCOSE-CAPILLARY: 99 mg/dL (ref 70–99)

## 2018-07-10 MED ORDER — OLANZAPINE 10 MG PO TABS: 10.0000 mg | ORAL_TABLET | ORAL | 0 refills | Status: DC

## 2018-07-10 MED ORDER — ESCITALOPRAM OXALATE 20 MG PO TABS: 20.0000 mg | ORAL_TABLET | Freq: Every day | ORAL | 0 refills | Status: DC

## 2018-07-10 MED ORDER — PROPRANOLOL HCL 10 MG PO TABS: 10.0000 mg | ORAL_TABLET | Freq: Two times a day (BID) | ORAL | 0 refills | Status: DC

## 2018-07-10 NOTE — Plan of Care (Signed)
  Problem: Education: Goal: Will be free of psychotic symptoms Outcome: Progressing  Patient is free of psychotic symptoms  

## 2018-07-10 NOTE — Progress Notes (Signed)
Recreation Therapy Notes  Date: 07/10/18  Time: 9:30 am  Location: Craft Room  Behavioral response: Appropriate   Intervention Topic: Communication  Discussion/Intervention:  Group content today was focused on communication. The group defined communication and ways to communicate with others. Individuals stated reason why communication is important and some reasons to communicate with others. Patients expressed if they thought they were good at communicating with others and ways they could improve their communication skills. The group identified important parts of communication and some experiences they have had in the past with communication. The group participated in the intervention "What is that?", where they had a chance to test out their communication skills and identify ways to improve their communication techniques.  Clinical Observations/Feedback:  Patient came to group and was focus on what peers and staff had to say about communication. Individual was social with peers and staff while participating in the intervention.  Cranston Koors LRT/CTRS        Shelitha Magley 07/10/2018 12:16 PM

## 2018-07-10 NOTE — Plan of Care (Signed)
Patient is alert and oriented, complains of depression, anxiety and hopelessness. Patient states she slept good; concentration is good, patient complains of physical pain in knee with intermittent pain. Patient states she would just like to focus on getting better. Patient has no self injurious behaviors. Patient is cooperative and appropriate to staff and peers. Safety checks Q 15 minutes to continue. Problem: Education: Goal: Will be free of psychotic symptoms Outcome: Not Progressing Goal: Knowledge of the prescribed therapeutic regimen will improve Outcome: Progressing   Problem: Coping: Goal: Coping ability will improve Outcome: Progressing Goal: Will verbalize feelings Outcome: Progressing   Problem: Health Behavior/Discharge Planning: Goal: Compliance with prescribed medication regimen will improve Outcome: Progressing   Problem: Safety: Goal: Ability to redirect hostility and anger into socially appropriate behaviors will improve Outcome: Progressing Goal: Ability to remain free from injury will improve Outcome: Progressing

## 2018-07-10 NOTE — Progress Notes (Signed)
D: Pt denies SI/HI, verbally able to contract for safety. Pt. Continues to report, "the same" auditory and visual hallucinations. Pt. Does though report lesser intensity during our conversations when referring to her hallucinations. Pt. Reports depression and anxiety this evening has elevated "8/10", but denies need for medications. Pt. Given support and therapeutic encouragement. Pt is pleasant and cooperative, engages actively with this Clinical research associatewriter.  Patient Interactions with staff and peers appropriate. Pt. Attends snacks, but mostly continues to isolate.   A: Q x 15 minute observation checks were completed for safety. Patient was provided with education, but needs reinforcement.  Patient was given/offered medications per orders. Patient  was encourage to attend groups, participate in unit activities and continue with plan of care. Pt. Chart and plans of care reviewed. Pt. Given support and encouragement.   R: Patient is complaint with medication and unit procedures with direction and encouragement. Blood sugars monitored per MD orders.             Precautionary checks every 15 minutes for safety maintained, room free of safety hazards, patient sustains no injury or falls during this shift. Will endorse care to next shift.

## 2018-07-10 NOTE — Progress Notes (Signed)
Patient alert and oriented x 4, no distress noted affect is flat but brightens upon approach, patient is interacting appropriately with peers and staff, patient makes appropriate eye contact, rated depression a 4/10 scale ( low 0- high 10) patient is complaint with medication will continue to monitor closely

## 2018-07-10 NOTE — Discharge Summary (Signed)
Physician Discharge Summary Note  Patient:  Nicole Cuevas is an 57 y.o., female MRN:  [id number] DOB:  [birth year] Patient phone:  [phone] (home)  Patient address:   [address] [place] [id number],  Total Time spent with patient:Pt not seen by this provider on day of discharge.  Date of Admission:  07/02/2018 Date of Discharge: 07/11/18  Reason for Admission:57 yo female admitted due to psychosis. She has history of schizophrenia. She was reported worsening paranoid and hallucinations. She reported seeing stabbings and people being hurt. She was also seeing spiders and various visual hallucinations. She lives in [place] and allegedly got on a bus aiming to go to [place] due to paranoia but ended up in [place]. She did admit to using cocaine yesterday.             Upon evaluation today, Pt is calm and polite. She states that she was "scared at my sister's house and we weren't getting along." She got on the bus to go to [place] "To find a new place to live. My brother said he knew of some low income housing." She states, "I got on the wrong bus in [place] and walked to the hospital." She states that she came here because "I was seeing things and hearing loud voices." She states that she always hears voices but they were much louder. They were telling her "You are going to die." She states that she is also having VH of "spiders and stuff." She states that this comes and goes. She also reports vague paranoia but very vague about this. She states that she has been "feeling hyper." She has not been sleeping well and "I have a lot on my mind." She denies SI or HI. She states that she was living with her sister in [place] but does not want to go back there and wants help getting a new apartment. She states "a boarding house is fine." She gets a disability check but has no money until the 3rd of next month. She does not want a group home. She states that she sees Barnet Pallenise Campbell at  [facility] in [place] and sees her monthly. She is not sure of the exact names of medications she is on. She admits she missed at least 2 days of her medications. She also admits to using crack cocaine "one time" and insists this is not a regular thing. She drank 1 beer 2 days ago and denies regular alcohol use. She is very vague with her symptoms but overall appears organized in thoughts. 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     Principal Problem: Schizophrenia [facility](HCC) Discharge Diagnoses: Principal Problem:   Schizophrenia [facility](HCC)   Past Psychiatric History: See H&P  Past Medical History:  Past Medical History:  Diagnosis Date  . Pre-diabetes     Past Surgical History:  Procedure Laterality Date  . schizophrenia     Family History: History reviewed. No pertinent family history. Family Psychiatric  History: See H&P Social History:  Social History   Substance and Sexual Activity  Alcohol Use Yes  . Alcohol/week: 1.0 standard drinks  . Types: 1 Cans of beer per week     Social History   Substance and Sexual Activity  Drug Use Yes  . Types: Cocaine    Social History   Socioeconomic History  . Marital status: Single    Spouse name: Not on file  . Number of children: Not  on file  . Years of education: Not on file  . Highest education level: Not on file  Occupational History  . Not on file  Social Needs  . Financial resource strain: Not on file  . Food insecurity:    Worry: Not on file    Inability: Not on file  . Transportation needs:    Medical: Not on file    Non-medical: Not on file  Tobacco Use  . Smoking status: Current Every Day Smoker    Packs/day: 1.00    Years: 40.00    Pack years: 40.00  . Smokeless tobacco: Never Used  Substance and Sexual Activity  . Alcohol use: Yes    Alcohol/week: 1.0 standard drinks    Types: 1 Cans of beer per week  . Drug use: Yes    Types: Cocaine  .  Sexual activity: Not on file  Lifestyle  . Physical activity:    Days per week: Not on file    Minutes per session: Not on file  . Stress: Not on file  Relationships  . Social connections:    Talks on phone: Not on file    Gets together: Not on file    Attends religious service: Not on file    Active member of club or organization: Not on file    Attends meetings of clubs or organizations: Not on file    Relationship status: Not on file  Other Topics Concern  . Not on file  Social History Narrative  . Not on file    Hospital Course:  Pt was restarted on Zyprexa and increased to 10 mg qam and 20 mg qhs. She was also started on Propranolol 10 mg Bid for possible akathisia. Cogentin was discontinued as there was some symptoms of tardive dyskinesia with movements in her hands. Pt was overall very vague with symptoms during admission. She was perseverative on wanting her own apartment so possibly some malingering traits. She likely does have baseline delusions and AH, VH. CT head was done to rule out any organic cause of VH and was negative.  She was social and appropriate on the unit. She had no agitation or outbursts. She was caring for her ADLs well. She was consistently oriented and goal directed. She displayed better insight by day of discharge. She was willing to be discharged with her sister and stay with her until she can find alternative housing. She plans to spend Thanksgiving with family. I was in contact with her sister several times during admission. Pt consistently denied SI or thoughts of self harm.    Physical Findings: AIMS: Facial and Oral Movements Muscles of Facial Expression: Moderate Lips and Perioral Area: Moderate Jaw: Minimal Tongue: Minimal,Extremity Movements Upper (arms, wrists, hands, fingers): Moderate Lower (legs, knees, ankles, toes): None, normal, Trunk Movements Neck, shoulders, hips: None, normal, Overall Severity Severity of abnormal movements (highest  score from questions above): Moderate Incapacitation due to abnormal movements: Minimal Patient's awareness of abnormal movements (rate only patient's report): Aware, no distress, Dental Status Current problems with teeth and/or dentures?: Yes Does patient usually wear dentures?: Yes  CIWA:    COWS:      Psychiatric Specialty Exam: Pt was not seen by this provider on day of discharge.  Have you used any form of tobacco in the last 30 days? (Cigarettes, Smokeless Tobacco, Cigars, and/or Pipes): Yes  Has this patient used any form of tobacco in the last 30 days? (Cigarettes, Smokeless Tobacco, Cigars, and/or Pipes) Yes, Yes, A prescription for an FDA-approved tobacco cessation medication was offered at discharge and the patient refused  Blood Alcohol level:  Lab Results  Component Value Date   [facility] <10 07/01/2018    Metabolic Disorder Labs:  Lab Results  Component Value Date   HGBA1C 6.3 (H) 07/01/2018   MPG 134.11 07/01/2018   No results found for: PROLACTIN Lab Results  Component Value Date   CHOL 189 07/01/2018   TRIG 121 07/01/2018   HDL 68 07/01/2018   CHOLHDL 2.8 07/01/2018   VLDL 24 07/01/2018   LDLCALC 97 07/01/2018    See Psychiatric Specialty Exam and Suicide Risk Assessment completed by Attending Physician prior to discharge.  Discharge destination:  Home  Is patient on multiple antipsychotic therapies at discharge:  No   Has Patient had three or more failed trials of antipsychotic monotherapy by history:  No  Recommended Plan for Multiple Antipsychotic Therapies: NA   Allergies as of 07/11/2018   Not on File     Medication List    STOP taking these medications   benztropine 1 MG tablet Commonly known as:  COGENTIN     TAKE these medications     Indication  escitalopram 20 MG tablet Commonly known as:  LEXAPRO Take 1 tablet (20 mg total) by mouth daily.   Indication:  Major Depressive Disorder   hydrOXYzine 25 MG capsule Commonly known as:  VISTARIL Take 25 mg by mouth 2 (two) times daily as needed.  Indication:  Feeling Anxious   metFORMIN 500 MG tablet Commonly known as:  GLUCOPHAGE Take 500 mg by mouth 2 (two) times daily with a meal.  Indication:  Type 2 Diabetes   OLANZapine 10 MG tablet Commonly known as:  ZYPREXA Take 1-2 tablets (10-20 mg total) by mouth See admin instructions. Take 1 tablet (10 mg) in the morning and 2 tablets (20 mg) at bedtime What changed:    how much to take  when to take this  additional instructions  Indication:  Schizophrenia   propranolol 10 MG tablet Commonly known as:  INDERAL Take 1 tablet (10 mg total) by mouth 2 (two) times daily.  Indication:  akathisia      Follow-up Information    [facility]. Go on 07/13/2018.   Why:  Please follow up with your provider-Diane Orvan Falconer at [facility] on Monday, July 13, 2018 at 11:40am. Thank you. Contact information: [address] [place] [place]  [place], [place] [address] [phone]            Signed: Haskell Riling, MD 07/10/2018, 2:24 PM

## 2018-07-10 NOTE — [facility] Suicide Risk Assessment (Signed)
[facility] INPATIENT:  Family/Significant Other Suicide Prevention Education  Suicide Prevention Education:  Education Completed; Nicole Cuevas, sister, [phone] has been identified by the patient as the family member/significant other with whom the patient will be residing, and identified as the person(s) who will aid the patient in the event of a mental health crisis (suicidal ideations/suicide attempt).  With written consent from the patient, the family member/significant other has been provided the following suicide prevention education, prior to the and/or following the discharge of the patient.  The suicide prevention education provided includes the following:  Suicide risk factors  Suicide prevention and interventions  National Suicide Hotline telephone number  [facility] assessment telephone number  [facility] Emergency Assistance 911  [facility] and/or Residential Mobile Crisis Unit telephone number  Request made of family/significant other to:  Remove weapons (e.g., guns, rifles, knives), all items previously/currently identified as safety concern.    Remove drugs/medications (over-the-counter, prescriptions, illicit drugs), all items previously/currently identified as a safety concern.  The family member/significant other verbalizes understanding of the suicide prevention education information provided.  The family member/significant other agrees to remove the items of safety concern listed above. Nicole Cuevas reports pt will return to her home upon discharge. She denies pt having access to guns or weapons in the home but raises concern about pt walking to corner store alone, especially during the evening hours. Nicole Cuevas says she suspects pt may be purchasing drugs while at the store. Nicole Cuevas encouraged to call 911 or take pt to nearest emergency department in the event pt needs additional medical attention.  Nicole Cuevas 07/10/2018, 9:49 AM

## 2018-07-10 NOTE — Plan of Care (Addendum)
Pt. Complaint with medications. Pt. Verbalizes actively during assessments. Pt. Denies si/hi, verbally is able contract for safety to remain safe while on the unit. Pt. Continues to report auditory and visual hallucinations. Pt. Needs reinforcement on provided education. Pt. Reports increased depression and anxiety this evening, but denies need for PRN medications.   Problem: Education: Goal: Will be free of psychotic symptoms Outcome: Not Progressing Goal: Knowledge of the prescribed therapeutic regimen will improve Outcome: Not Progressing   Problem: Coping: Goal: Coping ability will improve Outcome: Not Progressing   Problem: Coping: Goal: Will verbalize feelings Outcome: Progressing   Problem: Health Behavior/Discharge Planning: Goal: Compliance with prescribed medication regimen will improve Outcome: Progressing   Problem: Safety: Goal: Ability to remain free from injury will improve Outcome: Progressing   Problem: Safety: Goal: Periods of time without injury will increase Outcome: Progressing   Problem: Safety: Goal: Ability to disclose and discuss suicidal ideas will improve Outcome: Progressing   Problem: Safety: Goal: Ability to remain free from injury will improve Outcome: Progressing

## 2018-07-10 NOTE — Progress Notes (Addendum)
[facility]  07/10/2018 2:17 PM Nicole MimesDarlene Cuevas  MRN:  [id number] Subjective:  Pt is bright and social today. She is out in the milieu frequently and socializing with staff and peers. She is playing basketball today. She states that she feels better. The voices are still there but much quieter and no longer distracting. She reports VH are better. She denies SI or thoughts of self harm. She talks about Thanksgiving and the family will be getting together for dinner. She states that she is in charge of the potato salad. She is organized in thoughts. She does not appear to be responding to internal stimuli. She states that she is sleeping much better.   Principal Problem: Schizophrenia (HCC) Diagnosis: Principal Problem:   Schizophrenia (HCC)  Total Time spent with patient: 20 minutes  Past Psychiatric History: See h&p  Past Medical History:  Past Medical History:  Diagnosis Date  . Pre-diabetes     Past Surgical History:  Procedure Laterality Date  . schizophrenia     Family History: History reviewed. No pertinent family history. Family Psychiatric  History: See H&P Social History:  Social History   Substance and Sexual Activity  Alcohol Use Yes  . Alcohol/week: 1.0 standard drinks  . Types: 1 Cans of beer per week     Social History   Substance and Sexual Activity  Drug Use Yes  . Types: Cocaine    Social History   Socioeconomic History  . Marital status: Single    Spouse name: Not on file  . Number of children: Not on file  . Years of education: Not on file  . Highest education level: Not on file  Occupational History  . Not on file  Social Needs  . Financial resource strain: Not on file  . Food insecurity:    Worry: Not on file    Inability: Not on file  . Transportation needs:    Medical: Not on file    Non-medical: Not on file  Tobacco Use  . Smoking status: Current Every Day Smoker    Packs/day: 1.00    Years: 40.00    Pack years: 40.00  .  Smokeless tobacco: Never Used  Substance and Sexual Activity  . Alcohol use: Yes    Alcohol/week: 1.0 standard drinks    Types: 1 Cans of beer per week  . Drug use: Yes    Types: Cocaine  . Sexual activity: Not on file  Lifestyle  . Physical activity:    Days per week: Not on file    Minutes per session: Not on file  . Stress: Not on file  Relationships  . Social connections:    Talks on phone: Not on file    Gets together: Not on file    Attends religious service: Not on file    Active member of club or organization: Not on file    Attends meetings of clubs or organizations: Not on file    Relationship status: Not on file  Other Topics Concern  . Not on file  Social History Narrative  . Not on file   Additional Social History:                         Sleep: Good  Appetite:  Good  Current Medications: Current Facility-Administered Medications  Medication Dose Route Frequency Provider Last Rate Last Dose  . acetaminophen (TYLENOL) tablet 650 mg  650 mg Oral Q6H PRN Clapacs, Jackquline DenmarkJohn T, MD  650 mg at 07/06/18 1623  . alum & mag hydroxide-simeth (MAALOX/MYLANTA) 200-200-20 MG/5ML suspension 30 mL  30 mL Oral Q4H PRN Clapacs, Jackquline Denmark, MD   30 mL at 07/09/18 1805  . escitalopram (LEXAPRO) tablet 20 mg  20 mg Oral Daily Clapacs, John T, MD   20 mg at 07/10/18 0911  . hydrOXYzine (ATARAX/VISTARIL) tablet 25 mg  25 mg Oral TID PRN Clapacs, Jackquline Denmark, MD   25 mg at 07/09/18 2221  . magnesium hydroxide (MILK OF MAGNESIA) suspension 30 mL  30 mL Oral Daily PRN Clapacs, John T, MD      . metFORMIN (GLUCOPHAGE) tablet 500 mg  500 mg Oral BID WC He, Jun, MD   500 mg at 07/10/18 0911  . nicotine (NICODERM CQ - dosed in mg/24 hours) patch 14 mg  14 mg Transdermal Daily Clapacs, Jackquline Denmark, MD   14 mg at 07/10/18 0912  . OLANZapine (ZYPREXA) tablet 10 mg  10 mg Oral q morning - 10a He, Jun, MD   10 mg at 07/10/18 0910  . OLANZapine (ZYPREXA) tablet 20 mg  20 mg Oral QHS He, Jun, MD   20 mg  at 07/09/18 2128  . pantoprazole (PROTONIX) EC tablet 40 mg  40 mg Oral Daily Vivien Barretto, Ileene Hutchinson, MD   40 mg at 07/10/18 0911  . propranolol (INDERAL) tablet 10 mg  10 mg Oral BID Haskell Riling, MD   10 mg at 07/10/18 0911  . traZODone (DESYREL) tablet 50 mg  50 mg Oral QHS PRN Haskell Riling, MD   50 mg at 07/09/18 2221    Lab Results:  Results for orders placed or performed during the hospital encounter of 07/02/18 (from the past 48 hour(s))  Glucose, capillary     Status: None   Collection Time: 07/08/18  4:22 PM  Result Value Ref Range   Glucose-Capillary 85 70 - 99 mg/dL  Glucose, capillary     Status: Abnormal   Collection Time: 07/08/18  8:50 PM  Result Value Ref Range   Glucose-Capillary 100 (H) 70 - 99 mg/dL   Comment 1 Notify RN   Glucose, capillary     Status: Abnormal   Collection Time: 07/09/18  7:16 AM  Result Value Ref Range   Glucose-Capillary 101 (H) 70 - 99 mg/dL  Glucose, capillary     Status: None   Collection Time: 07/09/18 11:25 AM  Result Value Ref Range   Glucose-Capillary 76 70 - 99 mg/dL  Glucose, capillary     Status: Abnormal   Collection Time: 07/09/18  8:40 PM  Result Value Ref Range   Glucose-Capillary 102 (H) 70 - 99 mg/dL  Glucose, capillary     Status: None   Collection Time: 07/10/18  6:48 AM  Result Value Ref Range   Glucose-Capillary 90 70 - 99 mg/dL   Comment 1 Notify RN     Blood Alcohol level:  Lab Results  Component Value Date   ETH <10 07/01/2018    Metabolic Disorder Labs: Lab Results  Component Value Date   HGBA1C 6.3 (H) 07/01/2018   MPG 134.11 07/01/2018   No results found for: PROLACTIN Lab Results  Component Value Date   CHOL 189 07/01/2018   TRIG 121 07/01/2018   HDL 68 07/01/2018   CHOLHDL 2.8 07/01/2018   VLDL 24 07/01/2018   LDLCALC 97 07/01/2018    Physical Findings: AIMS: Facial and Oral Movements Muscles of Facial Expression: Moderate Lips and Perioral Area: Moderate Jaw:  Minimal Tongue:  Minimal,Extremity Movements Upper (arms, wrists, hands, fingers): Moderate Lower (legs, knees, ankles, toes): None, normal, Trunk Movements Neck, shoulders, hips: None, normal, Overall Severity Severity of abnormal movements (highest score from questions above): Moderate Incapacitation due to abnormal movements: Minimal Patient's awareness of abnormal movements (rate only patient's report): Aware, no distress, Dental Status Current problems with teeth and/or dentures?: Yes Does patient usually wear dentures?: Yes  CIWA:    COWS:     Musculoskeletal: Strength & Muscle Tone: within normal limits Gait & Station: normal Patient leans: N/A  Psychiatric Specialty Exam: Physical Exam  Nursing note and vitals reviewed.   Review of Systems  All other systems reviewed and are negative.   Blood pressure 135/71, pulse 79, temperature (!) 97.5 F (36.4 C), temperature source Oral, resp. rate 18, height 5\' 3"  (1.6 m), weight 100.2 kg, SpO2 96 %.Body mass index is 39.15 kg/m.  General Appearance: Casual  Eye Contact:  Good  Speech:  Clear and Coherent  Volume:  Normal  Mood:  Euthymic  Affect:  Appropriate  Thought Process:  Coherent and Goal Directed  Orientation:  Full (Time, Place, and Person)  Thought Content:  Logical  Suicidal Thoughts:  No  Homicidal Thoughts:  No  Memory:  Immediate;   Fair  Judgement:  Fair  Insight:  Fair  Psychomotor Activity:  Normal  Concentration:  Concentration: Fair  Recall:  [employer] of Knowledge:  Fair  Language:  Fair  Akathisia:  No      Assets:  Resilience  ADL's:  Intact  Cognition:  WNL  Sleep:  Number of Hours: 7.5     Treatment Plan Summary: 57 yo female admitted due to [facility], VH. She appears to be very close to her baseline. She is caring for herself well. She is bright and social with peers. She reports AH are improving.   Plan:  Schizophrenia -Continue Zyprexa 10 mg qam and 20 mg qhs -Continue Lexapro 20 mg  daily  Akathisia -Continue Propranolol 10 mg BID  DM Continue Metformin 500 mg BID  GERD Protonix  -Spoke with her sister who can pick her up on Saturday  Pt has been living with her sister. Pt requires family member to discharge with as she has history of impulsively running to other states when she is alone  Haskell Riling, MD 07/10/2018, 2:17 PM

## 2018-07-10 NOTE — Progress Notes (Signed)
Patient ID: Nicole Cuevas, female   DOB: 17-Aug-1961, 57 y.o.   MRN: [id number] PER STATE REGULATIONS 482.30  THIS CHART WAS REVIEWED FOR MEDICAL NECESSITY WITH RESPECT TO THE PATIENT'S ADMISSION/ DURATION OF STAY.  NEXT REVIEW DATE: 07/14/2018  Willa RoughJENNIFER JONES Dnaiel Voller, RN, BSN CASE MANAGER

## 2018-07-11 DIAGNOSIS — F200 Paranoid schizophrenia: Principal | ICD-10-CM

## 2018-07-11 LAB — GLUCOSE, CAPILLARY
Glucose-Capillary: 105 mg/dL — ABNORMAL HIGH (ref 70–99)
Glucose-Capillary: 103 mg/dL — ABNORMAL HIGH (ref 70–99)

## 2018-07-11 NOTE — Progress Notes (Signed)
  [facility] :  Will you be returning to the same living situation after discharge:  Yes,  pt returning with family  At discharge, do you have transportation home?: Yes,  pt was picked up by family member Do you have the ability to pay for your medications: Yes,  pt has insurance  Release of information consent forms completed and in the chart;  Patient's signature needed at discharge.  Patient to Follow up at: Follow-up Information    [facility]. Go on 07/13/2018.   Why:  Please follow up with your provider-Diane Orvan [facility]Campbell [facility] on Monday, July 13, 2018 at 11:40am. Thank you. Contact information: [address],  [place], [place] [address] [phone]          Next level of care provider [facility]:yes  Safety Planning and Suicide Prevention discussed: Yes,  CSW discussed with patient  Have you used any form of tobacco in the last 30 days? (Cigarettes, Smokeless Tobacco, Cigars, and/or Pipes): Yes  Has patient been referred to the Quitline?: N/A patient is not a smoker  Patient has been referred for addiction treatment: Yes  Corderius Saraceni  [name], LCSW 07/11/2018, 2:02 PM

## 2018-07-11 NOTE — BHH Suicide Risk Assessment (Signed)
[facility]   Principal Problem: Schizophrenia [facility](HCC) Discharge Diagnoses: Principal Problem:   Schizophrenia (HCC)   Total Time spent with patient: 45 minutes  Musculoskeletal: Strength & Muscle Tone: within normal limits Gait & Station: normal Patient leans: N/A  Psychiatric Specialty Exam: Review of Systems  Constitutional: Negative.   HENT: Negative.   Eyes: Negative.   Respiratory: Negative.   Cardiovascular: Negative.   Gastrointestinal: Negative.   Musculoskeletal: Negative.   Skin: Negative.   Neurological: Negative.   Psychiatric/Behavioral: Positive for hallucinations. Negative for depression, memory loss, substance abuse and suicidal ideas. The patient is not nervous/anxious and does not have insomnia.     Blood pressure 117/69, pulse 69, temperature 98.4 F (36.9 C), temperature source Oral, resp. rate 16, height 5\' 3"  (1.6 m), weight 100.2 kg, SpO2 94 %.Body mass index is 39.15 kg/m.  General Appearance: Fairly Groomed  Patent attorneyye Contact::  Good  Speech:  Normal Rate409  Volume:  Decreased  Mood:  Euthymic  Affect:  Congruent  Thought Process:  Goal Directed  Orientation:  Full (Time, Place, and Person)  Thought Content:  Logical and Hallucinations: Auditory  Suicidal Thoughts:  No  Homicidal Thoughts:  No  Memory:  Immediate;   Fair Recent;   Fair Remote;   Fair  Judgement:  Fair  Insight:  Fair  Psychomotor Activity:  Normal  Concentration:  Good  Recall:  Good  Fund of Knowledge:Good  Language: Good  Akathisia:  No  Handed:  Right  AIMS (if indicated):     Assets:  Communication Skills Desire for Improvement Housing Social Support  Sleep:  Number of Hours: 7.15  Cognition: WNL  ADL's:  Intact   Mental Status Per Nursing Assessment::   On Admission:  NA  Demographic Factors:  Age 57 or older  Loss Factors: Financial problems/change in socioeconomic status  Historical Factors: NA  Risk Reduction Factors:    Religious beliefs about death, Living with another person, especially a relative, Positive social support and Positive therapeutic relationship  Continued Clinical Symptoms:  Schizophrenia:   Paranoid or undifferentiated type  Cognitive Features That Contribute To Risk:  None    Suicide Risk:  Minimal: No identifiable suicidal ideation.  Patients presenting with no risk factors but with morbid ruminations; may be classified as minimal risk based on the severity of the depressive symptoms  Follow-up Information    [facility]. Go on 07/13/2018.   Why:  Please follow up with your provider-Diane Orvan [facility]Campbell [facility] on Monday, July 13, 2018 at 11:40am. Thank you. Contact information: [address],  [place], [place] [address] [phone]          Plan Of Care/Follow-up recommendations:  Activity:  That he has tolerated Diet:  Regular diet Other:  [place] in [place] after discharge continue current medicine  Mordecai RasmussenJohn Darrin Koman, MD 07/11/2018, 12:44 PM

## 2018-07-11 NOTE — BHH Group Notes (Signed)
LCSW Group Therapy Note  07/11/2018 1:15pm  Type of Therapy and Topic:  Group Therapy:  Healthy Self Image and Positive Change  Participation Level:  None   Description of Group:  In this group, patients will compare and contrast their current "I am...." statements to the visions they identify as desirable for their lives.  Patients discuss fears and how they can make positive changes in their cognitions that will positively impact their behaviors.  Facilitator played a motivational 3-minute speech and patients were left with the task of thinking about what "I am...." statements they can start using in their lives immediately.  Therapeutic Goals: 1. Patient will state their current self-perception as expressed in an "I Am" statement 2. Patient will contrast this with their desired vision for their live 3. Patient will identify 3 fears that negatively impact their behavior 4. Patient will discuss cognitive distortions that stem from their fears 5. Patient will verbalize statements that challenge their cognitive distortions  Summary of Patient Progress:  The patient reported she feels anxious about being discharge. The patient left group after introductions.    Therapeutic Modalities Cognitive Behavioral Therapy Motivational Interviewing  Ralene Gasparyan  [name], LCSW 07/11/2018 12:11 PM

## 2018-07-11 NOTE — Discharge Summary (Signed)
Physician Discharge Summary Note  Patient:  Nicole Cuevas is an 57 y.o., female MRN:  [id number] DOB:  Feb 20, 1961 Patient phone:  [phone] (home)  Patient address:   [address] [place] [address],  Total Time spent with patient: 45 minutes  Date of Admission:  07/02/2018 Date of Discharge: 07/11/2018  Reason for Admission: Admitted through the emergency room for hallucinations paranoia failure to take care of herself in a patient with chronic schizophrenia  Principal Problem: Schizophrenia [facility]) Discharge Diagnoses: Principal Problem:   Schizophrenia (HCC)   Past Psychiatric History: Patient has a history of long-standing schizophrenia previous stability but recently decompensated and wandered away from home and became noncompliant.  No history of suicide attempts or violence.  Past Medical History:  Past Medical History:  Diagnosis Date  . Pre-diabetes     Past Surgical History:  Procedure Laterality Date  . schizophrenia     Family History: History reviewed. No pertinent family history. Family Psychiatric  History: None known Social History:  Social History   Substance and Sexual Activity  Alcohol Use Yes  . Alcohol/week: 1.0 standard drinks  . Types: 1 Cans of beer per week     Social History   Substance and Sexual Activity  Drug Use Yes  . Types: Cocaine    Social History   Socioeconomic History  . Marital status: Single    Spouse name: Not on file  . Number of children: Not on file  . Years of education: Not on file  . Highest education level: Not on file  Occupational History  . Not on file  Social Needs  . Financial resource strain: Not on file  . Food insecurity:    Worry: Not on file    Inability: Not on file  . Transportation needs:    Medical: Not on file    Non-medical: Not on file  Tobacco Use  . Smoking status: Current Every Day Smoker    Packs/day: 1.00    Years: 40.00    Pack years: 40.00  . Smokeless tobacco:  Never Used  Substance and Sexual Activity  . Alcohol use: Yes    Alcohol/week: 1.0 standard drinks    Types: 1 Cans of beer per week  . Drug use: Yes    Types: Cocaine  . Sexual activity: Not on file  Lifestyle  . Physical activity:    Days per week: Not on file    Minutes per session: Not on file  . Stress: Not on file  Relationships  . Social connections:    Talks on phone: Not on file    Gets together: Not on file    Attends religious service: Not on file    Active member of club or organization: Not on file    Attends meetings of clubs or organizations: Not on file    Relationship status: Not on file  Other Topics Concern  . Not on file  Social History Narrative  . Not on file    Hospital Course: Patient was admitted to the psychiatric ward and restarted on antipsychotic medication.  Medical problems were managed appropriately.  Patient engaged in groups and activities and did not display any dangerous or violent behavior.  Patient became calm and more lucid and less paranoid.  Expressed a willingness to go back to [facility] and live with her sister.  Patient continues to endorse some hallucinations but states that she never really gets rid of them and feels unbothered by them.  Has no  complaints about her medication  Physical Findings: AIMS: Facial and Oral Movements Muscles of Facial Expression: Moderate Lips and Perioral Area: Moderate Jaw: Minimal Tongue: Minimal,Extremity Movements Upper (arms, wrists, hands, fingers): Moderate Lower (legs, knees, ankles, toes): None, normal, Trunk Movements Neck, shoulders, hips: None, normal, Overall Severity Severity of abnormal movements (highest score from questions above): Moderate Incapacitation due to abnormal movements: Minimal Patient's awareness of abnormal movements (rate only patient's report): Aware, no distress, Dental Status Current problems with teeth and/or dentures?: Yes Does patient usually wear dentures?: Yes   CIWA:    COWS:     Musculoskeletal: Strength & Muscle Tone: within normal limits Gait & Station: normal Patient leans: N/A  Psychiatric Specialty Exam: Physical Exam  Nursing note and vitals reviewed. Constitutional: She appears well-developed and well-nourished.  HENT:  Head: Normocephalic and atraumatic.  Eyes: Pupils are equal, round, and reactive to light. Conjunctivae are normal.  Neck: Normal range of motion.  Cardiovascular: Regular rhythm and normal heart sounds.  Respiratory: Effort normal. No respiratory distress.  GI: Soft.  Musculoskeletal: Normal range of motion.  Neurological: She is alert.  Skin: Skin is warm and dry.  Psychiatric: She has a normal mood and affect. Her behavior is normal. Judgment and thought content normal.    Review of Systems  Constitutional: Negative.   HENT: Negative.   Eyes: Negative.   Respiratory: Negative.   Cardiovascular: Negative.   Gastrointestinal: Negative.   Musculoskeletal: Negative.   Skin: Negative.   Neurological: Negative.   Psychiatric/Behavioral: Positive for hallucinations. Negative for depression, substance abuse and suicidal ideas.    Blood pressure 117/69, pulse 69, temperature 98.4 F (36.9 C), temperature source Oral, resp. rate 16, height 5\' 3"  (1.6 m), weight 100.2 kg, SpO2 94 %.Body mass index is 39.15 kg/m.  General Appearance: Fairly Groomed  Eye Contact:  Good  Speech:  Clear and Coherent  Volume:  Normal  Mood:  Euthymic  Affect:  Congruent  Thought Process:  Goal Directed  Orientation:  Full (Time, Place, and Person)  Thought Content:  Logical  Suicidal Thoughts:  No  Homicidal Thoughts:  No  Memory:  Immediate;   Fair Recent;   Fair Remote;   Fair  Judgement:  Fair  Insight:  Fair  Psychomotor Activity:  Normal  Concentration:  Concentration: Fair  Recall:  Fair  Fund of Knowledge:  Fair  Language:  Fair  Akathisia:  No  Handed:  Right  AIMS (if indicated):     Assets:  Desire for  Improvement Housing Physical Health Resilience Social Support  ADL's:  Intact  Cognition:  WNL  Sleep:  Number of Hours: 7.15     Have you used any form of tobacco in the last 30 days? (Cigarettes, Smokeless Tobacco, Cigars, and/or Pipes): Yes  Has this patient used any form of tobacco in the last 30 days? (Cigarettes, Smokeless Tobacco, Cigars, and/or Pipes) Yes, No  Blood Alcohol level:  Lab Results  Component Value Date   ETH <10 07/01/2018    Metabolic Disorder Labs:  Lab Results  Component Value Date   HGBA1C 6.3 (H) 07/01/2018   MPG 134.11 07/01/2018   No results found for: PROLACTIN Lab Results  Component Value Date   CHOL 189 07/01/2018   TRIG 121 07/01/2018   HDL 68 07/01/2018   CHOLHDL 2.8 07/01/2018   VLDL 24 07/01/2018   LDLCALC 97 07/01/2018    See Psychiatric Specialty Exam and Suicide Risk Assessment completed by Attending Physician prior to discharge.  Discharge destination:  Home  Is patient on multiple antipsychotic therapies at discharge:  No   Has Patient had three or more failed trials of antipsychotic monotherapy by history:  No  Recommended Plan for Multiple Antipsychotic Therapies: NA  Discharge Instructions    Diet - low sodium heart healthy   Complete by:  As directed    Increase activity slowly   Complete by:  As directed      Allergies as of 07/11/2018   Not on File     Medication List    STOP taking these medications   benztropine 1 MG tablet Commonly known as:  COGENTIN     TAKE these medications     Indication  escitalopram 20 MG tablet Commonly known as:  LEXAPRO Take 1 tablet (20 mg total) by mouth daily.  Indication:  Major Depressive Disorder   hydrOXYzine 25 MG capsule Commonly known as:  VISTARIL Take 25 mg by mouth 2 (two) times daily as needed.  Indication:  Feeling Anxious   metFORMIN 500 MG tablet Commonly known as:  GLUCOPHAGE Take 500 mg by mouth 2 (two) times daily with a meal.  Indication:   Type 2 Diabetes   OLANZapine 10 MG tablet Commonly known as:  ZYPREXA Take 1-2 tablets (10-20 mg total) by mouth See admin instructions. Take 1 tablet (10 mg) in the morning and 2 tablets (20 mg) at bedtime What changed:    how much to take  when to take this  additional instructions  Indication:  Schizophrenia   propranolol 10 MG tablet Commonly known as:  INDERAL Take 1 tablet (10 mg total) by mouth 2 (two) times daily.  Indication:  akathisia      Follow-up Information    [facility]. Go on 07/13/2018.   Why:  Please follow up with your provider-Diane Orvan [facility]Campbell [facility] on Monday, July 13, 2018 at 11:40am. Thank you. Contact information: [address],  [place], [place] [address] [phone]          Follow-up recommendations:  Activity:  Activity is as tolerated Diet:  Regular diet Other:  Follow-up with psychiatric outpatient treatment back home  Comments: Patient is agreeable to the plan.  Says she likes her medicine and agreeable to continue them.  No evidence of current dangerousness.  Discharge plan to proceed today.  Signed: Mordecai RasmussenJohn Jolyne Laye, MD 07/11/2018, 12:47 PM

## 2019-05-31 ENCOUNTER — Encounter: Payer: Self-pay | Admitting: Emergency Medicine

## 2019-05-31 ENCOUNTER — Other Ambulatory Visit: Payer: Self-pay

## 2019-05-31 ENCOUNTER — Emergency Department
Admission: EM | Admit: 2019-05-31 | Discharge: 2019-06-01 | Disposition: A | Discharge status: Other Acute Inpatient Hospital | Payer: Medicare Other | Attending: Student | Admitting: Student

## 2019-05-31 DIAGNOSIS — Z20828 Contact with and (suspected) exposure to other viral communicable diseases: Secondary | ICD-10-CM | POA: Insufficient documentation

## 2019-05-31 DIAGNOSIS — F203 Undifferentiated schizophrenia: Secondary | ICD-10-CM | POA: Insufficient documentation

## 2019-05-31 DIAGNOSIS — F17200 Nicotine dependence, unspecified, uncomplicated: Secondary | ICD-10-CM | POA: Insufficient documentation

## 2019-05-31 DIAGNOSIS — F23 Brief psychotic disorder: Secondary | ICD-10-CM

## 2019-05-31 DIAGNOSIS — R440 Auditory hallucinations: Secondary | ICD-10-CM | POA: Diagnosis present

## 2019-05-31 DIAGNOSIS — R443 Hallucinations, unspecified: Secondary | ICD-10-CM

## 2019-05-31 HISTORY — DX: Schizophrenia, unspecified: F20.9

## 2019-05-31 LAB — URINE DRUG SCREEN, QUALITATIVE (ARMC ONLY)
Amphetamines, Ur Screen: NOT DETECTED
Barbiturates, Ur Screen: NOT DETECTED
Benzodiazepine, Ur Scrn: NOT DETECTED
Cannabinoid 50 Ng, Ur ~~LOC~~: NOT DETECTED
Cocaine Metabolite,Ur ~~LOC~~: NOT DETECTED
MDMA (Ecstasy)Ur Screen: NOT DETECTED
Methadone Scn, Ur: NOT DETECTED
Opiate, Ur Screen: NOT DETECTED
Phencyclidine (PCP) Ur S: NOT DETECTED
Tricyclic, Ur Screen: NOT DETECTED

## 2019-05-31 LAB — SARS CORONAVIRUS 2 BY RT PCR (HOSPITAL ORDER, PERFORMED IN ~~LOC~~ HOSPITAL LAB): SARS Coronavirus 2: NEGATIVE

## 2019-05-31 LAB — COMPREHENSIVE METABOLIC PANEL
ALT: 12 U/L (ref 0–44)
AST: 17 U/L (ref 15–41)
Albumin: 3.9 g/dL (ref 3.5–5.0)
Alkaline Phosphatase: 89 U/L (ref 38–126)
Anion gap: 9 (ref 5–15)
BUN: 13 mg/dL (ref 6–20)
CO2: 27 mmol/L (ref 22–32)
Calcium: 9.5 mg/dL (ref 8.9–10.3)
Chloride: 102 mmol/L (ref 98–111)
Creatinine, Ser: 1.1 mg/dL — ABNORMAL HIGH (ref 0.44–1.00)
GFR calc Af Amer: >60 mL/min (ref >60)
GFR calc non Af Amer: 56 mL/min — ABNORMAL LOW (ref >60)
Glucose, Bld: 132 mg/dL — ABNORMAL HIGH (ref 70–99)
Potassium: 3.8 mmol/L (ref 3.5–5.1)
Sodium: 138 mmol/L (ref 135–145)
Total Bilirubin: 0.5 mg/dL (ref 0.3–1.2)
Total Protein: 7.5 g/dL (ref 6.5–8.1)

## 2019-05-31 LAB — SALICYLATE LEVEL: Salicylate Lvl: <7 mg/dL (ref 2.8–30.0)

## 2019-05-31 LAB — ETHANOL: Alcohol, Ethyl (B): <10 mg/dL (ref <10)

## 2019-05-31 LAB — CBC
HCT: 43.8 % (ref 36.0–46.0)
Hemoglobin: 13.7 g/dL (ref 12.0–15.0)
MCH: 26.1 pg (ref 26.0–34.0)
MCHC: 31.3 g/dL (ref 30.0–36.0)
MCV: 83.6 fL (ref 80.0–100.0)
Platelets: 268 10*3/uL (ref 150–400)
RBC: 5.24 MIL/uL — ABNORMAL HIGH (ref 3.87–5.11)
RDW: 15.2 % (ref 11.5–15.5)
WBC: 11.8 10*3/uL — ABNORMAL HIGH (ref 4.0–10.5)
nRBC: 0 % (ref 0.0–0.2)

## 2019-05-31 LAB — ACETAMINOPHEN LEVEL: Acetaminophen (Tylenol), Serum: <10 ug/mL — ABNORMAL LOW (ref 10–30)

## 2019-05-31 MED ORDER — ATORVASTATIN CALCIUM 20 MG PO TABS
20.0000 mg | ORAL_TABLET | Freq: Every day | ORAL | Status: DC
  Administered 2019-05-31 – 2019-06-01 (×2): 20 mg via ORAL
  Filled 2019-05-31 (×2): qty 1

## 2019-05-31 MED ORDER — METFORMIN HCL 500 MG PO TABS
1000.0000 mg | ORAL_TABLET | Freq: Two times a day (BID) | ORAL | Status: DC
  Administered 2019-05-31 – 2019-06-01 (×3): 1000 mg via ORAL
  Filled 2019-05-31 (×3): qty 2

## 2019-05-31 MED ORDER — PANTOPRAZOLE SODIUM 40 MG PO TBEC
40.0000 mg | DELAYED_RELEASE_TABLET | Freq: Every day | ORAL | Status: DC
  Administered 2019-06-01: 40 mg via ORAL
  Filled 2019-05-31: qty 1

## 2019-05-31 MED ORDER — PROPRANOLOL HCL 10 MG PO TABS
10.0000 mg | ORAL_TABLET | Freq: Two times a day (BID) | ORAL | Status: DC
  Administered 2019-05-31 – 2019-06-01 (×3): 10 mg via ORAL
  Filled 2019-05-31 (×3): qty 1

## 2019-05-31 MED ORDER — ESCITALOPRAM OXALATE 10 MG PO TABS
20.0000 mg | ORAL_TABLET | Freq: Every day | ORAL | Status: DC
  Administered 2019-06-01: 20 mg via ORAL
  Filled 2019-05-31: qty 2

## 2019-05-31 MED ORDER — TRAZODONE HCL 50 MG PO TABS: 50.0000 mg | ORAL_TABLET | Freq: Every evening | ORAL | Status: DC | PRN

## 2019-05-31 MED ORDER — METFORMIN HCL 500 MG PO TABS: 500.0000 mg | ORAL_TABLET | Freq: Two times a day (BID) | ORAL | Status: DC

## 2019-05-31 NOTE — BH Assessment (Signed)
Assessment Note  Nicole Cuevas is an 58 y.o. female who presents to ED reporting auditory hallucinations. She reports these voices sound like her family members telling her "I'm gonna get you". Patient reports having thoughts that people have been hitting her in her head. Pt is linked with [facility] in [facility] for Peer Support services. Pt lives in [place] with her sister Nicole Cuevas: [phone]. TTS attempted to contact pt's sister for collateral information - no answer. Pt denied SI/HI. She was unable to identify her current mental health medications to this Probation officer. Pt was clear in her tone; however, she still appeared to be a poor historian.  Diagnosis: Schizophrenia, by history  Past Medical History:  Past Medical History:  Diagnosis Date  . Pre-diabetes   . Schizophrenia [facility])     Past Surgical History:  Procedure Laterality Date  . schizophrenia      Family History: No family history on file.  Social History:  reports that she has been smoking. She has a 40.00 pack-year smoking history. She has never used smokeless tobacco. She reports current alcohol use of about 1.0 standard drinks of alcohol per week. She reports current drug use. Drug: Cocaine.  Additional Social History:  Alcohol / Drug Use Pain Medications: See MAR Prescriptions: See MAR Over the Counter: See MAR History of alcohol / drug use?: No history of alcohol / drug abuse  CIWA: CIWA-Ar BP: (!) 128/55 Pulse Rate: 74 COWS:    Allergies: No Known Allergies  Home Medications: (Not in a hospital admission)   OB/GYN Status:  No LMP recorded. Patient is postmenopausal.  General Assessment Data Location of Assessment: [facility] ED TTS Assessment: In system Is this a Tele or Face-to-Face Assessment?: Face-to-Face Is this an Initial Assessment or a Re-assessment for this encounter?: Initial Assessment Patient Accompanied by:: N/A Language Other than English: No Living Arrangements: Other (Comment)(Private  Residence) What gender do you identify as?: Female Marital status: Single Maiden name: N/A Pregnancy Status: No Living Arrangements: Other relatives, Other (Comment)(Patient lives with her sister: Nicole Cuevas - [phone]) Can pt return to current living arrangement?: Yes Admission Status: Voluntary Is patient capable of signing voluntary admission?: Yes Referral Source: Self/Family/Friend Insurance type: Medicare  Medical Screening Exam ([place]) Medical Exam completed: Yes  Crisis Care Plan Living Arrangements: Other relatives, Other (Comment)(Patient lives with her sister: Nicole Cuevas [phone]) Legal Guardian: Other:(Self) Name of Psychiatrist: [facility] [place] Name of Therapist: [facility] [place]  Education Status Is patient currently in school?: No Is the patient employed, unemployed or receiving disability?: Receiving disability income  Risk to self with the past 6 months Suicidal Ideation: No Has patient been a risk to self within the past 6 months prior to admission? : No Suicidal Intent: No Has patient had any suicidal intent within the past 6 months prior to admission? : No Is patient at risk for suicide?: No Suicidal Plan?: No Has patient had any suicidal plan within the past 6 months prior to admission? : No Access to Means: No What has been your use of drugs/alcohol within the last 12 months?: None Previous Attempts/Gestures: No How many times?: 0 Other Self Harm Risks: None Triggers for Past Attempts: None known Intentional Self Injurious Behavior: None Family Suicide History: Unknown Recent stressful life event(s): Other (Comment)(Mental Health) Persecutory voices/beliefs?: Yes Depression: Yes Depression Symptoms: Loss of interest in usual pleasures, Insomnia Substance abuse history and/or treatment for substance abuse?: No Suicide prevention information given to non-admitted patients: Not applicable  Risk  to Others within the past 6  months Homicidal Ideation: No Does patient have any lifetime risk of violence toward others beyond the six months prior to admission? : No Thoughts of Harm to Others: No Current Homicidal Intent: No Current Homicidal Plan: No Access to Homicidal Means: No Identified Victim: None History of harm to others?: No Assessment of Violence: None Noted Violent Behavior Description: None Does patient have access to weapons?: No Criminal Charges Pending?: No Does patient have a court date: No Is patient on probation?: No  Psychosis Hallucinations: Auditory Delusions: None noted  Mental Status Report Appearance/Hygiene: In scrubs Eye Contact: Good Motor Activity: Freedom of movement Speech: Soft, Slow Level of Consciousness: Alert Mood: Depressed Affect: Flat Anxiety Level: Minimal Thought Processes: Coherent Judgement: Partial Orientation: Appropriate for developmental age, Person, Place, Time, Situation Obsessive Compulsive Thoughts/Behaviors: None  Cognitive Functioning Concentration: Normal Memory: Recent Intact, Remote Intact Is patient IDD: No Insight: Fair Impulse Control: Fair Appetite: Good Have you had any weight changes? : No Change Sleep: Decreased Total Hours of Sleep: 7 Vegetative Symptoms: None  ADLScreening [facility] Assessment Services) Patient's cognitive ability adequate to safely complete daily activities?: Yes Patient able to express need for assistance with ADLs?: Yes Independently performs ADLs?: Yes (appropriate for developmental age)  Prior Inpatient Therapy Prior Inpatient Therapy: No  Prior Outpatient Therapy Prior Outpatient Therapy: No Does patient have an ACCT team?: Yes(Peer Support: [place] with [facility]) Does patient have Intensive In-House Services?  : No Does patient have [facility] services? : No Does patient have P4CC services?: No  ADL Screening (condition at time of admission) Patient's cognitive ability adequate to safely  complete daily activities?: Yes Patient able to express need for assistance with ADLs?: Yes Independently performs ADLs?: Yes (appropriate for developmental age)       Abuse/Neglect Assessment (Assessment to be complete while patient is alone) Abuse/Neglect Assessment Can Be Completed: Yes Physical Abuse: Denies Verbal Abuse: Denies Sexual Abuse: Denies Exploitation of patient/patient's resources: Denies Self-Neglect: Denies Values / Beliefs Cultural Requests During Hospitalization: None Spiritual Requests During Hospitalization: None Consults Spiritual Care Consult Needed: No Social Work Consult Needed: No         Child/Adolescent Assessment Running Away Risk: (Patient is an adult)  Disposition:  Disposition Initial Assessment Completed for this Encounter: Yes Disposition of Patient: Admit Type of inpatient treatment program: Adult Patient refused recommended treatment: No Mode of transportation if patient is discharged/movement?: N/A Patient referred to: Other (Comment)([facility])  On Site Evaluation by:   Reviewed with Physician:    Wilmon Arms 05/31/2019 7:34 PM

## 2019-05-31 NOTE — ED Notes (Signed)
Black tennis shoes, black socks, black pants, black tshirt, blue jacket, white bra placed in pt belonging bag, blue back pack labeled with pt name and taken to quad RN.

## 2019-05-31 NOTE — ED Notes (Signed)
VOL  PT  GOING  TO  BEH  MED  TONIGHT PT  SEEN  BY  DR  Claris Gower MD

## 2019-05-31 NOTE — Consult Note (Signed)
[facility] Face-to-Face Psychiatry Consult   Reason for Consult: Hallucinations Referring Physician: Paschal Dopp Patient Identification: Nicole Cuevas MRN:  [id number] Principal Diagnosis: <principal problem not specified> Diagnosis:  Active Problems:   * No active hospital problems. *   Total Time spent with patient: 45 minutes  Subjective:   Nicole Cuevas is a 58 y.o. female patient resented with complaints of hallucinations.   HPI: Patient is a 58 year old female with a history of schizophrenia who presents with complaints of hallucinations command in origin telling her to harm others.  She denies plan or intent to harm others.  Patient states that the voices have been bothering her for more than 2 weeks now.  Patient is not currently taking medications, states that her provider had a mixup with the prescriptions and she has been without medications for several days.  Patient denies suicidal or homicidal ideas ideation at this time.  Patient states that she does feel paranoid at times scared as if the voices were going to take control and beat her in the head.  Patient is endorsing poor sleep and disorganized thoughts.  Despite feeling as if she cannot control patient is increasingly distressed by the symptoms and requesting voluntary admission.  Patient currently living with her sister and receiving psychiatric care and her.  Endorses drug-induced in the past but none currently.  Past Psychiatric History: Patient endorses a history of schizophrenia including prior inpatient hospitalizations she reports last hospitalization was approximately 1 year ago here at [facility].  Risk to Self:  Yes Risk to Others:  Yes Prior Inpatient Therapy:  Yes Prior Outpatient Therapy:  Yes  Past Medical History:  Past Medical History:  Diagnosis Date  . Pre-diabetes   . Schizophrenia [facility])     Past Surgical History:  Procedure Laterality Date  . schizophrenia     Family History: No family history on  file. Family Psychiatric  History: Patient is unsure Social History:  Social History   Substance and Sexual Activity  Alcohol Use Yes  . Alcohol/week: 1.0 standard drinks  . Types: 1 Cans of beer per week     Social History   Substance and Sexual Activity  Drug Use Yes  . Types: Cocaine    Social History   Socioeconomic History  . Marital status: Single    Spouse name: Not on file  . Number of children: Not on file  . Years of education: Not on file  . Highest education level: Not on file  Occupational History  . Not on file  Social Needs  . Financial resource strain: Not on file  . Food insecurity    Worry: Not on file    Inability: Not on file  . Transportation needs    Medical: Not on file    Non-medical: Not on file  Tobacco Use  . Smoking status: Current Every Day Smoker    Packs/day: 1.00    Years: 40.00    Pack years: 40.00  . Smokeless tobacco: Never Used  Substance and Sexual Activity  . Alcohol use: Yes    Alcohol/week: 1.0 standard drinks    Types: 1 Cans of beer per week  . Drug use: Yes    Types: Cocaine  . Sexual activity: Not on file  Lifestyle  . Physical activity    Days per week: Not on file    Minutes per session: Not on file  . Stress: Not on file  Relationships  . Social Musician on phone: Not  on file    Gets together: Not on file    Attends religious service: Not on file    Active member of club or organization: Not on file    Attends meetings of clubs or organizations: Not on file    Relationship status: Not on file  Other Topics Concern  . Not on file  Social History Narrative  . Not on file   Additional Social History:    Allergies:  No Known Allergies  Labs:  Results for orders placed or performed during the hospital encounter of 05/31/19 (from the past 48 hour(s))  Comprehensive metabolic panel     Status: Abnormal   Collection Time: 05/31/19  1:26 PM  Result Value Ref Range   Sodium 138 135 - 145 mmol/L    Potassium 3.8 3.5 - 5.1 mmol/L   Chloride 102 98 - 111 mmol/L   CO2 27 22 - 32 mmol/L   Glucose, Bld 132 (H) 70 - 99 mg/dL   BUN 13 6 - 20 mg/dL   Creatinine, Ser 1.611.10 (H) 0.44 - 1.00 mg/dL   Calcium 9.5 8.9 - 09.610.3 mg/dL   Total Protein 7.5 6.5 - 8.1 g/dL   Albumin 3.9 3.5 - 5.0 g/dL   AST 17 15 - 41 U/L   ALT 12 0 - 44 U/L   Alkaline Phosphatase 89 38 - 126 U/L   Total Bilirubin 0.5 0.3 - 1.2 mg/dL   GFR calc non Af Amer 56 (L) >60 mL/min   GFR calc Af Amer >60 >60 mL/min   Anion gap 9 5 - 15    Comment: Performed at [facility], [address] [place], [place] [address]  Ethanol     Status: None   Collection Time: 05/31/19  1:26 PM  Result Value Ref Range   Alcohol, Ethyl (B) <10 <10 mg/dL    Comment: (NOTE) Lowest detectable limit for serum alcohol is 10 mg/dL. For medical purposes only. Performed at [facility], [address] [place], [place] [address]   Salicylate level     Status: None   Collection Time: 05/31/19  1:26 PM  Result Value Ref Range   Salicylate Lvl <7.0 2.8 - 30.0 mg/dL    Comment: Performed at [facility], [address] [place], [place] [address]  Acetaminophen level     Status: Abnormal   Collection Time: 05/31/19  1:26 PM  Result Value Ref Range   Acetaminophen (Tylenol), Serum <10 (L) 10 - 30 ug/mL    Comment: (NOTE) Therapeutic concentrations vary significantly. A range of 10-30 ug/mL  may be an effective concentration for many patients. However, some  are best treated at concentrations outside of this range. Acetaminophen concentrations >150 ug/mL at 4 hours after ingestion  and >50 ug/mL at 12 hours after ingestion are often associated with  toxic reactions. Performed at [facility], [address] [place], [place] [address]   cbc     Status: Abnormal   Collection Time: 05/31/19  1:26 PM  Result Value Ref Range   WBC 11.8 (H) 4.0 - 10.5 K/uL   RBC 5.24 (H) 3.87 - 5.11 MIL/uL   Hemoglobin  13.7 12.0 - 15.0 g/dL   HCT 30.843.8 65.736.0 - 84.646.0 %   MCV 83.6 80.0 - 100.0 fL   MCH 26.1 26.0 - 34.0 pg   MCHC 31.3 30.0 - 36.0 g/dL   RDW 96.215.2 95.211.5 - 84.115.5 %   Platelets 268 150 - 400 K/uL  nRBC 0.0 0.0 - 0.2 %    Comment: Performed at [facility], [address] [place], [place] [address]  Urine Drug Screen, Qualitative     Status: None   Collection Time: 05/31/19  1:26 PM  Result Value Ref Range   Tricyclic, Ur Screen NONE DETECTED NONE DETECTED   Amphetamines, Ur Screen NONE DETECTED NONE DETECTED   MDMA (Ecstasy)Ur Screen NONE DETECTED NONE DETECTED   Cocaine Metabolite,Ur [place] NONE DETECTED NONE DETECTED   Opiate, Ur Screen NONE DETECTED NONE DETECTED   Phencyclidine (PCP) Ur S NONE DETECTED NONE DETECTED   Cannabinoid 50 Ng, Ur [place] NONE DETECTED NONE DETECTED   Barbiturates, Ur Screen NONE DETECTED NONE DETECTED   Benzodiazepine, Ur Scrn NONE DETECTED NONE DETECTED   Methadone Scn, Ur NONE DETECTED NONE DETECTED    Comment: (NOTE) Tricyclics + metabolites, urine    Cutoff 1000 ng/mL Amphetamines + metabolites, urine  Cutoff 1000 ng/mL MDMA (Ecstasy), urine              Cutoff 500 ng/mL Cocaine Metabolite, urine          Cutoff 300 ng/mL Opiate + metabolites, urine        Cutoff 300 ng/mL Phencyclidine (PCP), urine         Cutoff 25 ng/mL Cannabinoid, urine                 Cutoff 50 ng/mL Barbiturates + metabolites, urine  Cutoff 200 ng/mL Benzodiazepine, urine              Cutoff 200 ng/mL Methadone, urine                   Cutoff 300 ng/mL The urine drug screen provides only a preliminary, unconfirmed analytical test result and should not be used for non-medical purposes. Clinical consideration and professional judgment should be applied to any positive drug screen result due to possible interfering substances. A more specific alternate chemical method must be used in order to obtain a confirmed analytical result. Gas chromatography / mass spectrometry (GC/MS) is  the preferred confirmat ory method. Performed at [facility], [address], [place], [place] [address]     Current Facility-Administered Medications  Medication Dose Route Frequency Provider Last Rate Last Dose  . atorvastatin (LIPITOR) tablet 20 mg  20 mg Oral q1800 Miguel Aschoff., MD      . Melene Muller ON 06/01/2019] escitalopram (LEXAPRO) tablet 20 mg  20 mg Oral Daily Miguel Aschoff., MD      . metFORMIN (GLUCOPHAGE) tablet 1,000 mg  1,000 mg Oral BID WC Miguel Aschoff., MD      . Melene Muller ON 06/01/2019] pantoprazole (PROTONIX) EC tablet 40 mg  40 mg Oral Daily Miguel Aschoff., MD      . propranolol (INDERAL) tablet 10 mg  10 mg Oral BID Miguel Aschoff., MD      . traZODone (DESYREL) tablet 50 mg  50 mg Oral QHS PRN Miguel Aschoff., MD       Current Outpatient Medications  Medication Sig Dispense Refill  . albuterol (VENTOLIN HFA) 108 (90 Base) MCG/ACT inhaler Inhale 2 puffs into the lungs 3 (three) times daily as needed for wheezing or shortness of breath.    . ARISTADA 882 MG/3.2ML PRSY Take 882 mg by mouth every 6 (six) weeks.    Marland Kitchen atorvastatin (LIPITOR) 20 MG tablet Take 20 mg by mouth every evening.    . budesonide (PULMICORT) 180 MCG/ACT  inhaler Inhale 2 puffs into the lungs 2 (two) times daily.    Marland Kitchen escitalopram (LEXAPRO) 20 MG tablet Take 1 tablet (20 mg total) by mouth daily. 30 tablet 0  . metFORMIN (GLUCOPHAGE) 1000 MG tablet Take 1,000 mg by mouth 2 (two) times daily with a meal.     . pantoprazole (PROTONIX) 20 MG tablet Take 20 mg by mouth daily.    . propranolol (INDERAL) 10 MG tablet Take 1 tablet (10 mg total) by mouth 2 (two) times daily. 60 tablet 0  . traZODone (DESYREL) 50 MG tablet Take 50 mg by mouth at bedtime.      Musculoskeletal: Strength & Muscle Tone: within normal limits Gait & Station: normal Patient leans: N/A  Psychiatric Specialty Exam: Physical Exam  Review of Systems  Constitutional: Negative for fever.  HENT: Negative for hearing  loss.   Eyes: Negative for blurred vision.  Cardiovascular: Negative for chest pain.  Skin: Negative for rash.  Psychiatric/Behavioral: Positive for depression and hallucinations. Negative for substance abuse and suicidal ideas.    Blood pressure (!) 128/55, pulse 74, temperature 98.5 F (36.9 C), temperature source Oral, resp. rate 18, height 5\' 3"  (1.6 m), weight 105.2 kg, SpO2 95 %.Body mass index is 41.1 kg/m.  General Appearance: Disheveled  Eye Contact:  Good  Speech:  Normal Rate  Volume:  Decreased  Mood:  Depressed  Affect:  Appropriate  Thought Process:  Disorganized  Orientation:  Full (Time, Place, and Person)  Thought Content:  Hallucinations: Auditory Command:  to hurt others, slit throat  Suicidal Thoughts:  No  Homicidal Thoughts:  Yes.  without intent/plan  Memory:  Recent;   Good  Judgement:  Fair  Insight:  Present  Psychomotor Activity:  Normal  Concentration:  Concentration: Fair  Recall:  Good  Fund of Knowledge:  Good  Language:  Good  Akathisia:  No  Handed:  Right  AIMS (if indicated):     Assets:  Communication Skills Desire for Improvement Housing  ADL's:  Intact  Cognition:  WNL  Sleep:        Treatment Plan Summary: Daily contact with patient to assess and evaluate symptoms and progress in treatment  Diagnosis: schizophrenia  Assessment: 58 year old woman with a history of schizophrenia presenting with command auditory hallucinations in the context of medication noncompliance.  Patient is pleasant and cooperative at this time and agreeable to voluntary admission.  Therefore patient will be offered voluntary admission for the purposes of safety stabilization and medication management.  Medications: Unit provider to decide. Patient has history of olanzapine use during past admission.   Disposition: Recommend psychiatric Inpatient admission when medically cleared.  Dixie Dials, MD 05/31/2019 5:04 PM

## 2019-05-31 NOTE — ED Notes (Addendum)
Assumed care of patient patient reports hearing voices, states the voices are louder today than they usually are. Denies HI/SI. Patient to be admitted on voluntary status.

## 2019-05-31 NOTE — ED Notes (Signed)
PT  VOL  PENDING  PLACEMENT  PT  NOT  GOING  TO  BEH  MED  TONIGHT  PUT  IN  BY   ERROR

## 2019-05-31 NOTE — ED Triage Notes (Signed)
Pt states she is here because she has been hearing voices for 2 weeks now that they are going to kill her and cut her throat. States they are messing with her mind and emotions. Voices are telling her to kill herself but she has no desire to hurt herself or anyone else. NAD. Calm and cooperative in triage.

## 2019-05-31 NOTE — ED Provider Notes (Signed)
[facility] Emergency Department Provider Note  ____________________________________________   First MD Initiated Contact with Patient 05/31/19 1352     (approximate)  I have reviewed the triage vital signs and the nursing notes.  History  Chief Complaint Psychiatric Evaluation    HPI Elaria Osias is a 58 y.o. female with history of diabetes, schizophrenia who presents to the ED for hallucinations. Patient states she is hearing voices, for about 2 weeks now, that are saying they are going to harm her or are telling her to hurt herself. She does not want to act on these. She denies SI or HI. She is here voluntarily seeking assistance.    Past Medical Hx Past Medical History:  Diagnosis Date  . Pre-diabetes   . Schizophrenia [facility])     Problem List Patient Active Problem List   Diagnosis Date Noted  . Schizoaffective disorder ([place]) 07/02/2018  . Cocaine abuse ([place]) 07/02/2018  . Schizophrenia ([place]) 07/02/2018    Past Surgical Hx Past Surgical History:  Procedure Laterality Date  . schizophrenia      Medications Prior to Admission medications   Medication Sig Start Date End Date Taking? Authorizing Provider  escitalopram (LEXAPRO) 20 MG tablet Take 1 tablet (20 mg total) by mouth daily. 07/10/18   McNew, Tyson Babinski, MD  hydrOXYzine (VISTARIL) 25 MG capsule Take 25 mg by mouth 2 (two) times daily as needed.    [provider]  metFORMIN (GLUCOPHAGE) 500 MG tablet Take 500 mg by mouth 2 (two) times daily with a meal.    [provider]  OLANZapine (ZYPREXA) 10 MG tablet Take 1-2 tablets (10-20 mg total) by mouth See admin instructions. Take 1 tablet (10 mg) in the morning and 2 tablets (20 mg) at bedtime 07/10/18   McNew, Tyson Babinski, MD  propranolol (INDERAL) 10 MG tablet Take 1 tablet (10 mg total) by mouth 2 (two) times daily. 07/10/18   McNew, Tyson Babinski, MD    Allergies Patient has no known allergies.  Family Hx No family  history on file.  Social Hx Social History   Tobacco Use  . Smoking status: Current Every Day Smoker    Packs/day: 1.00    Years: 40.00    Pack years: 40.00  . Smokeless tobacco: Never Used  Substance Use Topics  . Alcohol use: Yes    Alcohol/week: 1.0 standard drinks    Types: 1 Cans of beer per week  . Drug use: Yes    Types: Cocaine     Review of Systems  Constitutional: Negative for fever, chills. Eyes: Negative for visual changes. ENT: Negative for sore throat. Cardiovascular: Negative for chest pain. Respiratory: Negative for shortness of breath. Gastrointestinal: Negative for nausea, vomiting.  Genitourinary: Negative for dysuria. Musculoskeletal: Negative for leg swelling. Skin: Negative for rash. Neurological: Negative for for headaches.   Physical Exam  Vital Signs: ED Triage Vitals  Enc Vitals Group     BP 05/31/19 1320 (!) 128/55     Pulse Rate 05/31/19 1320 74     Resp 05/31/19 1320 18     Temp 05/31/19 1320 98.5 F (36.9 C)     Temp Source 05/31/19 1320 Oral     SpO2 05/31/19 1320 95 %     Weight 05/31/19 1321 232 lb (105.2 kg)     Height 05/31/19 1321 5\' 3"  (1.6 m)     Head Circumference --      Peak Flow --      Pain Score  05/31/19 1320 10     Pain Loc --      Pain Edu? --      Excl. in GC? --     Constitutional: Alert and oriented. Calm and cooperative.  Head: Normocephalic. Atraumatic. Eyes: Conjunctivae clear. Sclera anicteric. Nose: No congestion. No rhinorrhea. Mouth/Throat: Mucous membranes are moist.  Neck: No stridor.   Cardiovascular: Normal rate. Extremities well perfused. Respiratory: Normal respiratory effort.  Gastrointestinal:  Non-distended.  Musculoskeletal: No lower extremity edema. No deformities. Neurologic:  Normal speech and language. No gross focal neurologic deficits are appreciated.  Skin: Skin is warm, dry and intact. No rash noted. Psychiatric: Mood and affect are appropriate for situation.  EKG  N/A     Radiology  N/A   Procedures  Procedure(s) performed (including critical care):  Procedures   Initial Impression / Assessment and Plan / ED Course  58 y.o. female with history of schizophrenia who presents to the ED for hallucinations, as above.   Presentation seems consistent w/ her known psychiatric history. No evidence of underlying acute metabolic, infectious, or toxicologic etiology. Basic screening labs without actionable derangements. Will obtain psychiatry and TTS consult. Patient agreeable and voluntary.    Final Clinical Impression(s) / ED Diagnosis  Final diagnoses:  Hallucination       Note:  This document was prepared using Dragon voice recognition software and may include unintentional dictation errors.   Miguel Aschoff., MD 05/31/19 [phone]

## 2019-06-01 ENCOUNTER — Inpatient Hospital Stay
Admission: AD | Admit: 2019-06-01 | Discharge: 2019-06-04 | DRG: 885 | Disposition: A | Discharge status: Home / Self Care | Payer: Medicare Other | Source: Intra-hospital | Attending: Psychiatry | Admitting: Psychiatry

## 2019-06-01 DIAGNOSIS — F203 Undifferentiated schizophrenia: Secondary | ICD-10-CM | POA: Diagnosis not present

## 2019-06-01 DIAGNOSIS — Z23 Encounter for immunization: Secondary | ICD-10-CM

## 2019-06-01 DIAGNOSIS — E119 Type 2 diabetes mellitus without complications: Secondary | ICD-10-CM | POA: Diagnosis present

## 2019-06-01 DIAGNOSIS — K219 Gastro-esophageal reflux disease without esophagitis: Secondary | ICD-10-CM | POA: Diagnosis present

## 2019-06-01 DIAGNOSIS — F209 Schizophrenia, unspecified: Principal | ICD-10-CM | POA: Diagnosis present

## 2019-06-01 DIAGNOSIS — G4700 Insomnia, unspecified: Secondary | ICD-10-CM | POA: Diagnosis present

## 2019-06-01 DIAGNOSIS — J45909 Unspecified asthma, uncomplicated: Secondary | ICD-10-CM | POA: Diagnosis present

## 2019-06-01 DIAGNOSIS — F17210 Nicotine dependence, cigarettes, uncomplicated: Secondary | ICD-10-CM | POA: Diagnosis present

## 2019-06-01 DIAGNOSIS — R440 Auditory hallucinations: Secondary | ICD-10-CM | POA: Diagnosis present

## 2019-06-01 MED ORDER — ATORVASTATIN CALCIUM 20 MG PO TABS
20.0000 mg | ORAL_TABLET | Freq: Every day | ORAL | Status: DC
  Administered 2019-06-02 – 2019-06-03 (×2): 20 mg via ORAL
  Filled 2019-06-01 (×2): qty 1

## 2019-06-01 MED ORDER — MAGNESIUM HYDROXIDE 400 MG/5ML PO SUSP: 30.0000 mL | Freq: Every day | ORAL | Status: DC | PRN

## 2019-06-01 MED ORDER — PANTOPRAZOLE SODIUM 40 MG PO TBEC
40.0000 mg | DELAYED_RELEASE_TABLET | Freq: Every day | ORAL | Status: DC
  Administered 2019-06-02 – 2019-06-04 (×3): 40 mg via ORAL
  Filled 2019-06-01 (×3): qty 1

## 2019-06-01 MED ORDER — METFORMIN HCL 500 MG PO TABS
1000.0000 mg | ORAL_TABLET | Freq: Two times a day (BID) | ORAL | Status: DC
  Administered 2019-06-02 – 2019-06-04 (×5): 1000 mg via ORAL
  Filled 2019-06-01 (×5): qty 2

## 2019-06-01 MED ORDER — ACETAMINOPHEN 325 MG PO TABS: 650.0000 mg | ORAL_TABLET | Freq: Four times a day (QID) | ORAL | Status: DC | PRN

## 2019-06-01 MED ORDER — PROPRANOLOL HCL 20 MG PO TABS
10.0000 mg | ORAL_TABLET | Freq: Two times a day (BID) | ORAL | Status: DC
  Administered 2019-06-02 – 2019-06-04 (×5): 10 mg via ORAL
  Filled 2019-06-01 (×5): qty 1

## 2019-06-01 MED ORDER — ALUM & MAG HYDROXIDE-SIMETH 200-200-20 MG/5ML PO SUSP: 30.0000 mL | ORAL | Status: DC | PRN

## 2019-06-01 NOTE — ED Notes (Signed)
Hourly rounding reveals patient in room. No complaints, stable, in no acute distress. Q15 minute rounds and monitoring via Security Cameras to continue. 

## 2019-06-01 NOTE — ED Notes (Signed)
Sandwich tray and, icee, and drink provided to pt.

## 2019-06-01 NOTE — ED Notes (Signed)
Hourly rounding reveals patient sleeping in room. No complaints, stable, in no acute distress. Q15 minute rounds and monitoring via Security Cameras to continue. 

## 2019-06-01 NOTE — BH Assessment (Signed)
Patient is to be admitted to [facility] by Dr. Claris Gower..  Attending Physician will be Dr. Weber Cooks.   Patient has been assigned to room 310, by [place].   Intake Paper Work has been signed and placed on patient chart.   ER staff is aware of the admission:  Nitchia, ER Secretary    Dr. Cherylann Banas, ER MD   Amy B., Patient's Nurse   Butch Penny, Patient Access.

## 2019-06-01 NOTE — ED Provider Notes (Signed)
-----------------------------------------   6:11 AM on 06/01/2019 -----------------------------------------   Blood pressure (!) 148/90, pulse 82, temperature 98.5 F (36.9 C), temperature source Oral, resp. rate 18, height 5\' 3"  (1.6 m), weight 105.2 kg, SpO2 95 %.  The patient is calm and cooperative at this time.  There have been no acute events since the last update.  Awaiting disposition plan from Behavioral Medicine and/or Social Work team(s).   Paulette Blanch, MD 06/01/19 [phone]

## 2019-06-01 NOTE — ED Notes (Signed)
Pt given meal tray.

## 2019-06-01 NOTE — ED Notes (Signed)
VOL/Pending Placement 

## 2019-06-01 NOTE — ED Notes (Addendum)
Report to include Situation, Background, Assessment, and Recommendations received from Amy B. RN. Patient alert and oriented, warm and dry, in no acute distress. Patient denies SI, HI, and pain. Patient states she hears voices without command and sees "pictures". Patient made aware of Q15 minute rounds and security cameras for their safety. Patient instructed to come to me with needs or concerns.

## 2019-06-01 NOTE — ED Notes (Signed)
Pt given meal tray and a sprite.  

## 2019-06-01 NOTE — ED Notes (Signed)
VOL  PENDING  GOING  TO  BEH MED  TONIGHT 

## 2019-06-02 DIAGNOSIS — F203 Undifferentiated schizophrenia: Secondary | ICD-10-CM

## 2019-06-02 LAB — LIPID PANEL
Cholesterol: 139 mg/dL (ref 0–200)
HDL: 57 mg/dL (ref >40)
LDL Cholesterol: 68 mg/dL (ref 0–99)
Total CHOL/HDL Ratio: 2.4 RATIO
Triglycerides: 70 mg/dL (ref <150)
VLDL: 14 mg/dL (ref 0–40)

## 2019-06-02 LAB — HEPATIC FUNCTION PANEL
ALT: 11 U/L (ref 0–44)
AST: 15 U/L (ref 15–41)
Albumin: 3.8 g/dL (ref 3.5–5.0)
Alkaline Phosphatase: 85 U/L (ref 38–126)
Bilirubin, Direct: <0.1 mg/dL (ref 0.0–0.2)
Total Bilirubin: 0.4 mg/dL (ref 0.3–1.2)
Total Protein: 7.4 g/dL (ref 6.5–8.1)

## 2019-06-02 LAB — TSH: TSH: 0.763 u[IU]/mL (ref 0.350–4.500)

## 2019-06-02 MED ORDER — INFLUENZA VAC SPLIT QUAD 0.5 ML IM SUSY
0.5000 mL | PREFILLED_SYRINGE | INTRAMUSCULAR | Status: AC
  Administered 2019-06-03: 0.5 mL via INTRAMUSCULAR
  Filled 2019-06-02: qty 0.5

## 2019-06-02 MED ORDER — ARIPIPRAZOLE LAUROXIL ER 882 MG/3.2ML IM PRSY
882.0000 mg | PREFILLED_SYRINGE | INTRAMUSCULAR | Status: DC
  Filled 2019-06-02: qty 1

## 2019-06-02 MED ORDER — ESCITALOPRAM OXALATE 10 MG PO TABS
20.0000 mg | ORAL_TABLET | Freq: Every day | ORAL | Status: DC
  Administered 2019-06-02 – 2019-06-04 (×3): 20 mg via ORAL
  Filled 2019-06-02 (×3): qty 2

## 2019-06-02 MED ORDER — TRAZODONE HCL 50 MG PO TABS
50.0000 mg | ORAL_TABLET | Freq: Every evening | ORAL | Status: DC | PRN
  Administered 2019-06-02 – 2019-06-03 (×2): 50 mg via ORAL
  Filled 2019-06-02 (×2): qty 1

## 2019-06-02 MED ORDER — FLUTICASONE PROPIONATE 50 MCG/ACT NA SUSP
2.0000 | Freq: Every day | NASAL | Status: DC
  Administered 2019-06-02 – 2019-06-04 (×3): 2 via NASAL
  Filled 2019-06-02: qty 16

## 2019-06-02 NOTE — Plan of Care (Signed)
Patient stated that she is hearing voices and seeing faces at times today but it is not bothering her much.Patient complaints that she could not sleep last night.Patient is pleasant and cooperative on approach.Denies SI,HI and AVH.Appetite and energy level good.Attended groups.Support and encouragement given.

## 2019-06-02 NOTE — BHH Counselor (Signed)
Adult Comprehensive Assessment  Patient ID: Nicole Cuevas, female   DOB: [birth year], 58 y.o.   MRN: [id number]  Information Source: Information source: Patient  Current Stressors:  Patient states their primary concerns and needs for treatment are:: "Hearing voices and seeing things" Patient states their goals for this hospitilization and ongoing recovery are:: "get well" Educational / Learning stressors: one year of college Employment / Job issues: unemployed on disability Family Relationships: pt reports good family Software engineer / Lack of resources (include bankruptcy): receives [employer] / Lack of housing: pt lives with her sister Physical health (include injuries & life threatening diseases): none reported Substance abuse: Pt denies current use    Living/Environment/Situation:  Living Arrangements: Other relatives Who else lives in the home?: sister How long has patient lived in current situation?: 4 years What is atmosphere in current home: supportive   Family History:  Marital status: Single Are you sexually active?: No What is your sexual orientation?: heterosexual Has your sexual activity been affected by drugs, alcohol, medication, or emotional stress?: no Does patient have children?: No   Childhood History:  By whom was/is the patient raised?: Both parents Description of patient's relationship with caregiver when they were a child: "good" Patient's description of current relationship with people who raised him/her: deceased How were you disciplined when you got in trouble as a child/adolescent?: "everything was good" Does patient have siblings?: Yes Number of Siblings: 36 Description of patient's current relationship with siblings: "good" Did patient suffer any verbal/emotional/physical/sexual abuse as a child?: No Did patient suffer from severe childhood neglect?: No Has patient ever been sexually abused/assaulted/raped as an adolescent or adult?:  No Was the patient ever a victim of a crime or a disaster?: No Witnessed domestic violence?: No Has patient been effected by domestic violence as an adult?: No   Education:  Highest grade of school patient has completed: 1 year of college Currently a student?: No Learning disability?: No   Employment/Work Situation:   Employment situation: On disability Why is patient on disability: mental health How long has patient been on disability: 30 years  Patient's job has been impacted by current illness: No What is the longest time patient has a held a job?: 20 years 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 [employer] Did You Receive Any Psychiatric Treatment/Services While in the [employer]?: No Are There Guns or Other Weapons in [place]?: No   Financial Resources:   Museum/gallery curator resources: Teacher, early years/pre, [employer], [employer], Food stamps Does patient have a Programmer, applications or guardian?: Yes Name of representative payee or guardian: Payee is Angellina Ferdinand - sister [phone]   Alcohol/Substance Abuse:   What has been your use of drugs/alcohol within the last 12 months?: denies use If attempted suicide, did drugs/alcohol play a role in this?: No Alcohol/Substance Abuse Treatment Hx: Denies past history Has alcohol/substance abuse ever caused legal problems?: No   Social Support System:   Patient's Community Support System: Fair Dietitian Support System: family Type of faith/religion: Disciple How does patient's faith help to cope with current illness?: "helps me no think about it"   Leisure/Recreation:   Leisure and Hobbies: "play basketball, tennis, soccer"   Strengths/Needs:   What is the patient's perception of their strengths?: "lose weight because I have to lose weight to get knee surgery and I lost about 20 pounds" Patient states they can use these personal strengths during their treatment to contribute to their recovery: "I don't know" Patient states  these  barriers may affect/interfere with their treatment: none reported Patient states these barriers may affect their return to the community: none reported   Discharge Plan:   Currently receiving community mental health services: Yes (From Whom)( [facility] [place]) Patient states concerns and preferences for aftercare planning are: Pt reports she will continue to go to [facility] Patient states they will know when they are safe and ready for discharge when: "I don't kow, once I get some sleep" Does patient have access to transportation?: Yes, pts sister Does patient have financial barriers related to discharge medications?: No Will patient be returning to same living situation after discharge?: Yes   Summary/Recommendations:   Summary and Recommendations (to be completed by the evaluator): Pt is a 58 yo female [facility] [place] [place] [facility] with her sister. Pt presents to the hospital seeking treatment for [facility] and medication stabilization. Pt has a diagnosis of Schizophrenia. Pt is single, unemployed on disability, has no children, reports good familial relationships, denies history of abuse/trauma, reports history of SA, has a payee (sister Zeffie Bickert), and is agreeable to continue current outpatient treatment with [facility]. Pt denies SI/HI, but endorses soem AVH reporting she hears voices telling her bad words and she sees "spiders". Recommendations for pt include: crisis stabilization, therapeutic milieu, encourage group attendance and participation, medication management for mood stabilization, and development for comprehensive mental wellness plan. CSW assessing for appropriate referrals.  Charlann Lange Licia Harl MSW LCSW 06/02/2019 9:49 AM

## 2019-06-02 NOTE — BHH Suicide Risk Assessment (Signed)
[place] INPATIENT:  Family/Significant Other Suicide Prevention Education  Suicide Prevention Education:  Education Completed; Nicole Cuevas, sister [phone] has been identified by the patient as the family member/significant other with whom the patient will be residing, and identified as the person(s) who will aid the patient in the event of a mental health crisis (suicidal ideations/suicide attempt).  With written consent from the patient, the family member/significant other has been provided the following suicide prevention education, prior to the and/or following the discharge of the patient.  The suicide prevention education provided includes the following:  Suicide risk factors  Suicide prevention and interventions  National Suicide Hotline telephone number  [facility] assessment telephone number  [facility] Emergency Assistance [place] and/or Residential Mobile Crisis Unit telephone number  Request made of family/significant other to:  Remove weapons (e.g., guns, rifles, knives), all items previously/currently identified as safety concern.    Remove drugs/medications (over-the-counter, prescriptions, illicit drugs), all items previously/currently identified as a safety concern.  The family member/significant other verbalizes understanding of the suicide prevention education information provided.  The family member/significant other agrees to remove the items of safety concern listed above.  CSW spoke with pts sister Nicole Cuevas who reported that when pt came back from her appointment with her psychiatrist, pt reported the voices were back and she wasn't sleeping at night and her psychiatrist was going to change her medications and then pt walked off. Per Nicole Cuevas, pt reported the voices were pounding in her head. Nicole Cuevas reported pt has not been sleeping lately and has been writing a lot of stuff from the bible and rewriting it, which is not normal for pt.  Per Nicole Cuevas this is the 2nd time that pt walked off after visiting with her psychiatrist and the last time they could not find pt for about a week. Nicole Cuevas expressed concerns about pts current psychiatrist and reports she does not feel like they are doing a good job because pt has had episodes after leaving the psychiatrist the last two times. Nicole Cuevas expresses concerns for SI/HI reporting that pt speaks on it often, but then says she will run away before she let the voices tell her to hurt her sister. Nicole Cuevas reported there are no guns in the home and expressed no concerns with pt returning at discharge. Nicole Cuevas reported she is pts payee, and pt is her own legal guardian.   [place] MSW LCSW 06/02/2019, 9:51 AM

## 2019-06-02 NOTE — Tx Team (Signed)
Initial Treatment Plan 06/02/2019 12:44 AM Aldine Contes [id number]    PATIENT STRESSORS: Medication change or noncompliance Substance abuse   PATIENT STRENGTHS: Communication skills Supportive family/friends   PATIENT IDENTIFIED PROBLEMS: Auditory Hallucinations  Depression                   DISCHARGE CRITERIA:  Improved stabilization in mood, thinking, and/or behavior Verbal commitment to aftercare and medication compliance  PRELIMINARY DISCHARGE PLAN: Outpatient therapy Return to previous living arrangement  PATIENT/FAMILY INVOLVEMENT: This treatment plan has been presented to and reviewed with the patient, Nicole Cuevas, and/or family member,  The patient and family have been given the opportunity to ask questions and make suggestions.  Isabella Bowens, RN 06/02/2019, 12:44 AM

## 2019-06-02 NOTE — BHH Suicide Risk Assessment (Signed)
[facility] Admission Suicide Risk Assessment   Nursing information obtained from:    Demographic factors:    Current Mental Status:    Loss Factors:    Historical Factors:    Risk Reduction Factors:     Total Time spent with patient: 1 hour Principal Problem: Schizophrenia ([place]) Diagnosis:  Principal Problem:   Schizophrenia ([place])  Subjective Data: Patient seen and chart reviewed.  Patient has schizophrenia.  She reports that recently, perhaps over days to weeks, her auditory hallucinations have gotten worse.  They have become louder and are disturbing to her.  She talked to her outpatient psychiatrist and then drove herself allegedly to [facility] for hospitalization.  Patient states she has been compliant with her regular medication.  She denies to me any specific stress.  Denies suicidal thoughts intents or plans.  Denies homicidal thoughts intents or plan.  No report in the admission of anything about suicidality.  She is expressing a desire to be cooperative with treatment  Continued Clinical Symptoms:    The "Alcohol Use Disorders Identification Test", Guidelines for Use in Primary Care, Second Edition.  World Pharmacologist [facility]). Score between 0-7:  no or low risk or alcohol related problems. Score between 8-15:  moderate risk of alcohol related problems. Score between 16-19:  high risk of alcohol related problems. Score 20 or above:  warrants further diagnostic evaluation for alcohol dependence and treatment.   CLINICAL FACTORS:   Schizophrenia:   Command hallucinatons Paranoid or undifferentiated type   Musculoskeletal: Strength & Muscle Tone: within normal limits Gait & Station: normal Patient leans: N/A  Psychiatric Specialty Exam: Physical Exam  Nursing note and vitals reviewed. Constitutional: She appears well-developed and well-nourished.  HENT:  Head: Normocephalic and atraumatic.  Eyes: Pupils are equal, round, and reactive to light. Conjunctivae are normal.   Neck: Normal range of motion.  Cardiovascular: Regular rhythm and normal heart sounds.  Respiratory: Effort normal.  GI: Soft.  Musculoskeletal: Normal range of motion.  Neurological: She is alert.  Patient has tardive dyskinesia not much changed from last evaluation  Skin: Skin is warm and dry.  Psychiatric: Judgment normal. Her affect is blunt. Her speech is delayed. She is withdrawn and actively hallucinating. Thought content is paranoid. Cognition and memory are impaired. She expresses no homicidal and no suicidal ideation.    Review of Systems  Constitutional: Negative.   HENT: Negative.   Eyes: Negative.   Respiratory: Negative.   Cardiovascular: Negative.   Gastrointestinal: Negative.   Musculoskeletal: Negative.   Skin: Negative.   Neurological: Negative.   Psychiatric/Behavioral: Positive for hallucinations. Negative for depression, substance abuse and suicidal ideas. The patient is nervous/anxious.     Blood pressure 121/80, pulse 80, temperature 99.8 F (37.7 C), temperature source Oral, resp. rate 17, SpO2 94 %.There is no height or weight on file to calculate BMI.  General Appearance: Casual  Eye Contact:  Fair  Speech:  Slow  Volume:  Decreased  Mood:  Dysphoric  Affect:  Constricted  Thought Process:  Goal Directed  Orientation:  Full (Time, Place, and Person)  Thought Content:  Hallucinations: Auditory and Paranoid Ideation  Suicidal Thoughts:  No  Homicidal Thoughts:  No  Memory:  Immediate;   Fair Recent;   Fair Remote;   Fair  Judgement:  Fair  Insight:  Fair  Psychomotor Activity:  Decreased and TD  Concentration:  Concentration: Fair  Recall:  [employer] of Knowledge:  Fair  Language:  Fair  Akathisia:  No  Handed:  Right  AIMS (if indicated):     Assets:  Desire for Improvement Housing Physical Health Social Support  ADL's:  Intact  Cognition:  Impaired,  Mild  Sleep:  Number of Hours: 4.3      COGNITIVE FEATURES THAT CONTRIBUTE TO  RISK:  None    SUICIDE RISK:   Minimal: No identifiable suicidal ideation.  Patients presenting with no risk factors but with morbid ruminations; may be classified as minimal risk based on the severity of the depressive symptoms  PLAN OF CARE: Patient with schizophrenia but with good insight into her condition.  No known past history of suicidal behavior.  Patient will be kept on 15-minute checks.  She will have treatment team involved.  She will be engaged in individual and group therapy and have medication management.  Suicidal ideation will be reassessed prior to discharge at which time she will be referred back to outpatient treatment.  I certify that inpatient services furnished can reasonably be expected to improve the patient's condition.   Mordecai Rasmussen, MD 06/02/2019, 4:08 PM

## 2019-06-02 NOTE — Progress Notes (Signed)
Patient admitted from ED for reports of hearing voices of various people speaking to her tell her to to read the bible.  She is Ox4, cooperative on approach, she reports being depressed. Currently in bed resting quietly. Searched by two staff no contraband found upon admission.

## 2019-06-02 NOTE — BHH Group Notes (Signed)
LCSW Group Therapy Note  06/02/2019 11:02 AM  Type of Therapy/Topic:  Group Therapy:  Emotion Regulation  Participation Level:  Minimal   Description of Group:   The purpose of this group is to assist patients in learning to regulate negative emotions and experience positive emotions. Patients will be guided to discuss ways in which they have been vulnerable to their negative emotions. These vulnerabilities will be juxtaposed with experiences of positive emotions or situations, and patients will be challenged to use positive emotions to combat negative ones. Special emphasis will be placed on coping with negative emotions in conflict situations, and patients will process healthy conflict resolution skills.  Therapeutic Goals: 1. Patient will identify two positive emotions or experiences to reflect on in order to balance out negative emotions 2. Patient will label two or more emotions that they find the most difficult to experience 3. 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   Therapeutic Modalities:   Cognitive Behavioral Therapy Feelings Identification Dialectical Behavioral Therapy   Evalina Field, MSW, LCSW Clinical Social Work 06/02/2019 11:02 AM

## 2019-06-02 NOTE — Progress Notes (Signed)
Recreation Therapy Notes    Date:06/02/2019  Time: 9:30 am  Location: Craft room  Behavioral response: Appropriate   Intervention Topic: Necessities  Discussion/Intervention:  Group content on today was focused on necessities. The group defined necessities and how they determine their necessities. Individuals expressed how many necessities they have and if it changes from day to day. Patients described the difference between wants and needs. The group explained how they have overspent on wants in the past. Individuals described a reoccurring necessity for them. The intervention "What I need" helped patients differentiate between wants and needs.  Clinical Observations/Feedback:  Patient came to group and explained that a main necessity for her is being active. Individual was social with peers and staff while participating in group.  Caylea Foronda LRT/CTRS         Jaekwon Mcclune 06/02/2019 11:28 AM

## 2019-06-02 NOTE — H&P (Signed)
Psychiatric Admission Assessment Adult  Patient Identification: Nicole Cuevas MRN:  [id number] Date of Evaluation:  06/02/2019 Chief Complaint:  schizophrenia Principal Diagnosis: Schizophrenia (HCC) Diagnosis:  Principal Problem:   Schizophrenia (HCC)  History of Present Illness: Patient is a 58 year old female that presented to the ED with reports of auditory hallucinations that were command and telling her to harm other people.  Patient presents today stating that she came to the hospital because she started having some auditory hallucinations has been going on for approximately about 2 weeks.  She states her sleep has been poor recently while she has been at home and sleeps approximately 3 to 4 hours at night.  She states that hallucinations have been getting worse and she said that it got to the point that she was feeling like someone was touching her head and that she was feeling footsteps walk down the hallway.  She states that she has had hallucinations on and off since she was 58 years old and she has been diagnosed with schizophrenia and is on medications for it.  She states that she is compliant with her medications and does not understand why the hallucinations got worse over the last 2 weeks.  She denies having any significant stressors and denies any alcohol or drug use.  She reports that she goes to [facility] for treatment for her psychiatry as well as for PCP.  She reports that she is unsure of exactly the medications that she takes or the doses and wants me to contact them so that we can give an accurate list.  She reports that she lives with her sister in an apartment and she can return there.  Patient states that she feels as though she is already improving some with her hallucinations even though she has not received any additional medication.  She denies any current suicidal or homicidal ideations and only reports having some auditory hallucinations earlier today that were not that  bad.  [facility] was contacted at [phone].  They are located in [place]  They reported that the patient is on trazodone 50 mg p.o. nightly, Lexapro 20 mg p.o. daily, Aristada 882 mg every 6 weeks and her last injection was on 05/12/2019 and her next injection is due on November 4.  They report that her next appointment with psychiatry is October 20 and her next appointment with her PCP is November 12.  Associated Signs/Symptoms: Depression Symptoms:  insomnia, (Hypo) Manic Symptoms:  Hallucinations, Anxiety Symptoms:  Denies Psychotic Symptoms:  Hallucinations: Auditory Tactile Visual PTSD Symptoms: NA Total Time spent with patient: 45 minutes  Past Psychiatric History: Schizophrenia, reports approximately 15 hospitalizations since she was 58 years old when she was first diagnosed with schizophrenia. Last hospitalization was noted for 06/2018 at [facility]  Is the patient at risk to self? No.  Has the patient been a risk to self in the past 6 months? No.  Has the patient been a risk to self within the distant past? Yes.    Is the patient a risk to others? No.  Has the patient been a risk to others in the past 6 months? No.  Has the patient been a risk to others within the distant past? No.   Prior Inpatient Therapy:   Prior Outpatient Therapy:    Alcohol Screening: 1. How often do you have a drink containing alcohol?: Never 2. How many drinks containing alcohol do you have on a typical day when you are drinking?: 1 or 2  3. How often do you have six or more drinks on one occasion?: Never AUDIT-C Score: 0 Alcohol Brief Interventions/Follow-up: AUDIT Score <7 follow-up not indicated Substance Abuse History in the last 12 months:  No. Consequences of Substance Abuse: NA Previous Psychotropic Medications: Yes  Psychological Evaluations: Yes  Past Medical History:  Past Medical History:  Diagnosis Date  . Pre-diabetes   . Schizophrenia [facility])     Past Surgical  History:  Procedure Laterality Date  . schizophrenia     Family History: No family history on file. Family Psychiatric  History: Reports that her brother and her sister both see a psychiatrist that she does not know other diagnoses Tobacco Screening: Have you used any form of tobacco in the last 30 days? (Cigarettes, Smokeless Tobacco, Cigars, and/or Pipes): Yes Tobacco use, Select all that apply: 5 or more cigarettes per day Are you interested in Tobacco Cessation Medications?: No, patient refused Counseled patient on smoking cessation including recognizing danger situations, developing coping skills and basic information about quitting provided: Refused/Declined practical counseling Social History:  Social History   Substance and Sexual Activity  Alcohol Use Yes  . Alcohol/week: 1.0 standard drinks  . Types: 1 Cans of beer per week     Social History   Substance and Sexual Activity  Drug Use Yes  . Types: Cocaine    Additional Social History:                           Allergies:  No Known Allergies Lab Results:  Results for orders placed or performed during the hospital encounter of 06/01/19 (from the past 48 hour(s))  Hepatic function panel     Status: None   Collection Time: 06/02/19  7:02 AM  Result Value Ref Range   Total Protein 7.4 6.5 - 8.1 g/dL   Albumin 3.8 3.5 - 5.0 g/dL   AST 15 15 - 41 U/L   ALT 11 0 - 44 U/L   Alkaline Phosphatase 85 38 - 126 U/L   Total Bilirubin 0.4 0.3 - 1.2 mg/dL   Bilirubin, Direct <1.6 0.0 - 0.2 mg/dL   Indirect Bilirubin NOT CALCULATED 0.3 - 0.9 mg/dL    Comment: Performed at [facility], [address] [place], [place] [address]  Lipid panel     Status: None   Collection Time: 06/02/19  7:02 AM  Result Value Ref Range   Cholesterol 139 0 - 200 mg/dL   Triglycerides 70 <454 mg/dL   HDL 57 >09 mg/dL   Total CHOL/HDL Ratio 2.4 RATIO   VLDL 14 0 - 40 mg/dL   LDL Cholesterol 68 0 - 99 mg/dL    Comment:         Total Cholesterol/HDL:CHD Risk Coronary Heart Disease Risk Table                     Men   Women  1/2 Average Risk   3.4   3.3  Average Risk       5.0   4.4  2 X Average Risk   9.6   7.1  3 X Average Risk  23.4   11.0        Use the calculated Patient Ratio above and the CHD Risk Table to determine the patient's CHD Risk.        ATP III CLASSIFICATION (LDL):  <100     mg/dL   Optimal  811-914  mg/dL  Near or Above                    Optimal  130-159  mg/dL   Borderline  160-189  mg/dL   High  >190     mg/dL   Very High Performed at [facility], [address] [place], [place] [address]   TSH     Status: None   Collection Time: 06/02/19  7:02 AM  Result Value Ref Range   TSH 0.763 0.350 - 4.500 uIU/mL    Comment: Performed by a 3rd Generation assay with a functional sensitivity of <=0.01 uIU/mL. Performed at [facility], [address] [place], [place] [address]     Blood Alcohol level:  Lab Results  Component Value Date   [facility] <10 05/31/2019   ETH <10 26/83/4196    Metabolic Disorder Labs:  Lab Results  Component Value Date   HGBA1C 6.3 (H) 07/01/2018   MPG 134.11 07/01/2018   No results found for: PROLACTIN Lab Results  Component Value Date   CHOL 139 06/02/2019   TRIG 70 06/02/2019   HDL 57 06/02/2019   CHOLHDL 2.4 06/02/2019   VLDL 14 06/02/2019   LDLCALC 68 06/02/2019   LDLCALC 97 07/01/2018    Current Medications: Current Facility-Administered Medications  Medication Dose Route Frequency Provider Last Rate Last Dose  . acetaminophen (TYLENOL) tablet 650 mg  650 mg Oral Q6H PRN Cristofano, Dorene Ar, MD      . alum & mag hydroxide-simeth (MAALOX/MYLANTA) 200-200-20 MG/5ML suspension 30 mL  30 mL Oral Q4H PRN Cristofano, Dorene Ar, MD      . Derrill Memo ON 06/23/2019] ARIPiprazole Lauroxil ER PRSY 882 mg  882 mg Oral Q6 weeks Charleton Deyoung, Lowry Ram, [place]      . atorvastatin (LIPITOR) tablet 20 mg  20 mg Oral q1800 Cristofano, Paul A, MD      .  escitalopram (LEXAPRO) tablet 20 mg  20 mg Oral Daily [place] Coldren, Lowry Ram, FNP   20 mg at 06/02/19 1156  . [START ON 06/03/2019] influenza vac split quadrivalent PF (FLUARIX) injection 0.5 mL  0.5 mL Intramuscular Tomorrow-1000 Clapacs, John T, MD      . magnesium hydroxide (MILK OF MAGNESIA) suspension 30 mL  30 mL Oral Daily PRN Cristofano, Paul A, MD      . metFORMIN (GLUCOPHAGE) tablet 1,000 mg  1,000 mg Oral BID WC Cristofano, Dorene Ar, MD   1,000 mg at 06/02/19 0840  . pantoprazole (PROTONIX) EC tablet 40 mg  40 mg Oral Daily Cristofano, Dorene Ar, MD   40 mg at 06/02/19 0840  . propranolol (INDERAL) tablet 10 mg  10 mg Oral BID Cristofano, Dorene Ar, MD   10 mg at 06/02/19 0840  . traZODone (DESYREL) tablet 50 mg  50 mg Oral QHS,MR X 1 Anyelin Mogle, Darnelle Maffucci B, FNP       PTA Medications: Medications Prior to Admission  Medication Sig Dispense Refill Last Dose  . albuterol (VENTOLIN HFA) 108 (90 Base) MCG/ACT inhaler Inhale 2 puffs into the lungs 3 (three) times daily as needed for wheezing or shortness of breath.     . ARISTADA 882 MG/3.2ML PRSY Take 882 mg by mouth every 6 (six) weeks.     Marland Kitchen atorvastatin (LIPITOR) 20 MG tablet Take 20 mg by mouth every evening.     . budesonide (PULMICORT) 180 MCG/ACT inhaler Inhale 2 puffs into the lungs 2 (two) times daily.     Marland Kitchen escitalopram (LEXAPRO) 20 MG  tablet Take 1 tablet (20 mg total) by mouth daily. 30 tablet 0   . metFORMIN (GLUCOPHAGE) 1000 MG tablet Take 1,000 mg by mouth 2 (two) times daily with a meal.      . pantoprazole (PROTONIX) 20 MG tablet Take 20 mg by mouth daily.     . propranolol (INDERAL) 10 MG tablet Take 1 tablet (10 mg total) by mouth 2 (two) times daily. 60 tablet 0   . traZODone (DESYREL) 50 MG tablet Take 50 mg by mouth at bedtime.       Musculoskeletal: Strength & Muscle Tone: within normal limits Gait & Station: normal Patient leans: N/A  Psychiatric Specialty Exam: Physical Exam  Nursing note and vitals reviewed. Constitutional:  She is oriented to person, place, and time. She appears well-developed and well-nourished.  Cardiovascular: Normal rate.  Respiratory: Effort normal.  Musculoskeletal: Normal range of motion.  Neurological: She is alert and oriented to person, place, and time.  Skin: Skin is warm.    Review of Systems  Constitutional: Negative.   HENT: Negative.   Eyes: Negative.   Respiratory: Negative.   Cardiovascular: Negative.   Gastrointestinal: Negative.   Genitourinary: Negative.   Musculoskeletal: Negative.   Skin: Negative.   Neurological: Negative.   Endo/Heme/Allergies: Negative.   Psychiatric/Behavioral: Positive for hallucinations.    Blood pressure 121/80, pulse 80, temperature 99.8 F (37.7 C), temperature source Oral, resp. rate 17, SpO2 94 %.There is no height or weight on file to calculate BMI.  General Appearance: Casual  Eye Contact:  Good  Speech:  Clear and Coherent and Normal Rate  Volume:  Normal  Mood:  Euthymic  Affect:  Congruent  Thought Process:  Coherent and Descriptions of Associations: Intact  Orientation:  Full (Time, Place, and Person)  Thought Content:  WDL and Hallucinations: Auditory  Suicidal Thoughts:  No  Homicidal Thoughts:  No  Memory:  Immediate;   Good Recent;   Good Remote;   Good  Judgement:  Fair  Insight:  Fair  Psychomotor Activity:  Normal  Concentration:  Concentration: Good  Recall:  Good  Fund of Knowledge:  Good  Language:  Good  Akathisia:  No  Handed:  Right  AIMS (if indicated):     Assets:  Communication Skills Desire for Improvement Financial Resources/Insurance Housing Resilience Social Support Transportation  ADL's:  Intact  Cognition:  WNL  Sleep:  Number of Hours: 4.3    Treatment Plan Summary: Daily contact with patient to assess and evaluate symptoms and progress in treatment and Medication management  Continue trazodone 50 mg p.o. nightly with added may repeat x1 for insomnia Continue Aristada 882 mg IM  every 6 weeks next dose due June 23, 2019 Continue Lexapro 20 mg p.o. daily for depression and anxiety At the current moment the patient is reporting to have improvement of her hallucinations without any additional treatment and feel that continuing the medications that she is currently on will benefit the patient and we will monitor the patient for the next 1 to 2 days to ensure stabilization.  Observation Level/Precautions:  15 minute checks  Laboratory:  Reviewed  Psychotherapy: Group therapy  Medications:  Since patient is showing improvement without any medication changes already will continue the Lexapro 20 mg p.o. daily, trazodone 50 mg p.o. nightly but will add may be repeated to assist with sleep, and the Aristada 882 mg IM every 6 weeks  Consultations: As needed  Discharge Concerns: None at this time  Estimated LOS: 1 to  2 days  Other:     Physician Treatment Plan for Primary Diagnosis: Schizophrenia (HCC) Long Term Goal(s): Improvement in symptoms so as ready for discharge  Short Term Goals: Ability to identify changes in lifestyle to reduce recurrence of condition will improve, Ability to verbalize feelings will improve, Ability to disclose and discuss suicidal ideas, Ability to demonstrate self-control will improve, Ability to identify and develop effective coping behaviors will improve, Ability to maintain clinical measurements within normal limits will improve and Compliance with prescribed medications will improve  Physician Treatment Plan for Secondary Diagnosis: Principal Problem:   Schizophrenia (HCC)  Long Term Goal(s): Improvement in symptoms so as ready for discharge  Short Term Goals: Ability to identify changes in lifestyle to reduce recurrence of condition will improve, Ability to verbalize feelings will improve, Ability to disclose and discuss suicidal ideas, Ability to demonstrate self-control will improve, Ability to identify and develop effective coping  behaviors will improve, Ability to maintain clinical measurements within normal limits will improve and Compliance with prescribed medications will improve  I certify that inpatient services furnished can reasonably be expected to improve the patient's condition.    Maryfrances Bunnellravis B Aubreanna Percle, FNP 10/14/20201:05 PM

## 2019-06-03 NOTE — [facility] Group Notes (Signed)
Balance In Life 06/03/2019 1PM  Type of Therapy/Topic:  Group Therapy:  Balance in Life  Participation Level:  Minimal  Description of Group:   This group will address the concept of balance and how it feels and looks when one is unbalanced. Patients will be encouraged to process areas in their lives that are out of balance and identify reasons for remaining unbalanced. Facilitators will guide patients in utilizing problem-solving interventions to address and correct the stressor making their life unbalanced. Understanding and applying boundaries will be explored and addressed for obtaining and maintaining a balanced life. Patients will be encouraged to explore ways to assertively make their unbalanced needs known to significant others in their lives, using other group members and facilitator for support and feedback.  Therapeutic Goals: 1. Patient will identify two or more emotions or situations they have that consume much of in their lives. 2. Patient will identify signs/triggers that life has become out of balance:  3. Patient will identify two ways to set boundaries in order to achieve balance in their lives:  4. Patient will demonstrate ability to communicate their needs through discussion and/or role plays  Summary of Patient Progress: Pt attended group session but minimal participation. Group members provided with self care worksheet and asked to identify areas of progress and challenges as it relates to finding balance in life. Pt did not participate in discussion.   Therapeutic Modalities:   Cognitive Behavioral Therapy Solution-Focused Therapy Assertiveness Training  Gilliam Hawkes Lynelle Smoke, LCSW

## 2019-06-03 NOTE — Progress Notes (Signed)
[facility] MD Progress Note  06/03/2019 3:47 PM Nicole MimesDarlene Cuevas  MRN:  [id number]   Subjective:  Patient is a 58 year old female that presented to the ED with reports of auditory hallucinations that were command and telling her to harm other people.  Patient presents today stating that she came to the hospital because she started having some auditory hallucinations has been going on for approximately about 2 weeks.  Patient reports today that she states that she is feeling better.  She states that she has continued to have some occasional auditory hallucinations but they have not been severe and they are continuously improving.  She denies having any suicidal or homicidal ideations and denies any visual hallucinations.  Patient reports that she feels that her medications are working for her just fine and that she is hoping to discharge home soon.  She states that she plans to return to live with her sister and that she plans to continue her follow-up appointments and stay compliant with her medications.  Principal Problem: Schizophrenia (HCC) Diagnosis: Principal Problem:   Schizophrenia (HCC)  Total Time spent with patient: 20 minutes  Past Psychiatric History: Schizophrenia, reports approximately 15 hospitalizations since she was 58 years old when she was first diagnosed with schizophrenia. [facility]  Past Medical History:  Past Medical History:  Diagnosis Date  . Pre-diabetes   . Schizophrenia [facility](HCC)     Past Surgical History:  Procedure Laterality Date  . schizophrenia     Family History: No family history on file. Family Psychiatric  History: Reports that her brother and her sister both see a psychiatrist that she does not know other diagnoses Social History:  Social History   Substance and Sexual Activity  Alcohol Use Yes  . Alcohol/week: 1.0 standard drinks  . Types: 1 Cans of beer per week     Social History   Substance and Sexual Activity   Drug Use Yes  . Types: Cocaine    Social History   Socioeconomic History  . Marital status: Single    Spouse name: Not on file  . Number of children: Not on file  . Years of education: Not on file  . Highest education level: Not on file  Occupational History  . Not on file  Social Needs  . Financial resource strain: Not on file  . Food insecurity    Worry: Not on file    Inability: Not on file  . Transportation needs    Medical: Not on file    Non-medical: Not on file  Tobacco Use  . Smoking status: Current Every Day Smoker    Packs/day: 1.00    Years: 40.00    Pack years: 40.00  . Smokeless tobacco: Never Used  Substance and Sexual Activity  . Alcohol use: Yes    Alcohol/week: 1.0 standard drinks    Types: 1 Cans of beer per week  . Drug use: Yes    Types: Cocaine  . Sexual activity: Not on file  Lifestyle  . Physical activity    Days per week: Not on file    Minutes per session: Not on file  . Stress: Not on file  Relationships  . Social Musicianconnections    Talks on phone: Not on file    Gets together: Not on file    Attends religious service: Not on file    Active member of club or organization: Not on file    Attends meetings of clubs or organizations:  Not on file    Relationship status: Not on file  Other Topics Concern  . Not on file  Social History Narrative  . Not on file   Additional Social History:                         Sleep: Good  Appetite:  Good  Current Medications: Current Facility-Administered Medications  Medication Dose Route Frequency Provider Last Rate Last Dose  . acetaminophen (TYLENOL) tablet 650 mg  650 mg Oral Q6H PRN Cristofano, Dorene Ar, MD      . alum & mag hydroxide-simeth (MAALOX/MYLANTA) 200-200-20 MG/5ML suspension 30 mL  30 mL Oral Q4H PRN Cristofano, Dorene Ar, MD      . Derrill Memo ON 06/23/2019] ARIPiprazole Lauroxil ER PRSY 882 mg  882 mg Oral Q6 weeks Daeshon Grammatico, Lowry Ram, [place]      . atorvastatin (LIPITOR) tablet 20 mg   20 mg Oral q1800 Cristofano, Dorene Ar, MD   20 mg at 06/02/19 1635  . escitalopram (LEXAPRO) tablet 20 mg  20 mg Oral Daily Sharetha Newson, Lowry Ram, FNP   20 mg at 06/03/19 2831  . fluticasone (FLONASE) 50 MCG/ACT nasal spray 2 spray  2 spray Each Nare Daily Deloros Beretta, Lowry Ram, FNP   2 spray at 06/03/19 0829  . magnesium hydroxide (MILK OF MAGNESIA) suspension 30 mL  30 mL Oral Daily PRN Cristofano, Paul A, MD      . metFORMIN (GLUCOPHAGE) tablet 1,000 mg  1,000 mg Oral BID WC Cristofano, Dorene Ar, MD   1,000 mg at 06/03/19 0828  . pantoprazole (PROTONIX) EC tablet 40 mg  40 mg Oral Daily Cristofano, Dorene Ar, MD   40 mg at 06/03/19 0828  . propranolol (INDERAL) tablet 10 mg  10 mg Oral BID Cristofano, Dorene Ar, MD   10 mg at 06/03/19 0827  . traZODone (DESYREL) tablet 50 mg  50 mg Oral QHS,MR X 1 Brookelynne Dimperio, Lowry Ram, FNP   50 mg at 06/02/19 2121    Lab Results:  Results for orders placed or performed during the hospital encounter of 06/01/19 (from the past 48 hour(s))  Hepatic function panel     Status: None   Collection Time: 06/02/19  7:02 AM  Result Value Ref Range   Total Protein 7.4 6.5 - 8.1 g/dL   Albumin 3.8 3.5 - 5.0 g/dL   AST 15 15 - 41 U/L   ALT 11 0 - 44 U/L   Alkaline Phosphatase 85 38 - 126 U/L   Total Bilirubin 0.4 0.3 - 1.2 mg/dL   Bilirubin, Direct <0.1 0.0 - 0.2 mg/dL   Indirect Bilirubin NOT CALCULATED 0.3 - 0.9 mg/dL    Comment: Performed at [facility], [address] [place], [place] [address]  Lipid panel     Status: None   Collection Time: 06/02/19  7:02 AM  Result Value Ref Range   Cholesterol 139 0 - 200 mg/dL   Triglycerides 70 <150 mg/dL   HDL 57 >40 mg/dL   Total CHOL/HDL Ratio 2.4 RATIO   VLDL 14 0 - 40 mg/dL   LDL Cholesterol 68 0 - 99 mg/dL    Comment:        Total Cholesterol/HDL:CHD Risk Coronary Heart Disease Risk Table                     Men   Women  1/2 Average Risk   3.4   3.3  Average Risk  5.0   4.4  2 X Average Risk   9.6   7.1  3 X  Average Risk  23.4   11.0        Use the calculated Patient Ratio above and the CHD Risk Table to determine the patient's CHD Risk.        ATP III CLASSIFICATION (LDL):  <100     mg/dL   Optimal  749-449  mg/dL   Near or Above                    Optimal  130-159  mg/dL   Borderline  675-916  mg/dL   High  >384     mg/dL   Very High Performed at [facility], [address] [place], [place] [address]   TSH     Status: None   Collection Time: 06/02/19  7:02 AM  Result Value Ref Range   TSH 0.763 0.350 - 4.500 uIU/mL    Comment: Performed by a 3rd Generation assay with a functional sensitivity of <=0.01 uIU/mL. Performed at [facility], [address] [place], [place] [address]     Blood Alcohol level:  Lab Results  Component Value Date   [facility] <10 05/31/2019   ETH <10 07/01/2018    Metabolic Disorder Labs: Lab Results  Component Value Date   HGBA1C 6.3 (H) 07/01/2018   MPG 134.11 07/01/2018   No results found for: PROLACTIN Lab Results  Component Value Date   CHOL 139 06/02/2019   TRIG 70 06/02/2019   HDL 57 06/02/2019   CHOLHDL 2.4 06/02/2019   VLDL 14 06/02/2019   LDLCALC 68 06/02/2019   LDLCALC 97 07/01/2018    Physical Findings: AIMS:  , ,  ,  ,    CIWA:    COWS:     Musculoskeletal: Strength & Muscle Tone: within normal limits Gait & Station: normal Patient leans: N/A  Psychiatric Specialty Exam: Physical Exam  Nursing note and vitals reviewed. Constitutional: She is oriented to person, place, and time. She appears well-developed and well-nourished.  Cardiovascular: Normal rate.  Respiratory: Effort normal.  Musculoskeletal: Normal range of motion.  Neurological: She is alert and oriented to person, place, and time.  Skin: Skin is warm.    Review of Systems  Constitutional: Negative.   HENT: Negative.   Eyes: Negative.   Respiratory: Negative.   Cardiovascular: Negative.   Gastrointestinal: Negative.   Genitourinary:  Negative.   Musculoskeletal: Negative.   Skin: Negative.   Neurological: Negative.   Endo/Heme/Allergies: Negative.   Psychiatric/Behavioral: Positive for hallucinations (Auditory).    Blood pressure 140/70, pulse 72, temperature 98.1 F (36.7 C), temperature source Oral, resp. rate 18, SpO2 90 %.There is no height or weight on file to calculate BMI.  General Appearance: Casual  Eye Contact:  Good  Speech:  Clear and Coherent and Normal Rate  Volume:  Normal  Mood:  Euthymic  Affect:  Congruent  Thought Process:  Coherent and Descriptions of Associations: Intact  Orientation:  Full (Time, Place, and Person)  Thought Content:  WDL  Suicidal Thoughts:  No  Homicidal Thoughts:  No  Memory:  Immediate;   Good Recent;   Good Remote;   Good  Judgement:  Fair  Insight:  Fair  Psychomotor Activity:  Normal  Concentration:  Concentration: Good  Recall:  Good  Fund of Knowledge:  Good  Language:  Good  Akathisia:  No  Handed:  Right  AIMS (if indicated):  Assets:  Communication Skills Desire for Improvement Financial Resources/Insurance Housing Resilience Social Support Transportation  ADL's:  Intact  Cognition:  WNL  Sleep:  Number of Hours: 6.45   Assessment: Patient presents in the day room and is interacting with peers and staff appropriately.  Patient is pleasant, calm, and cooperative.  Patient has congruent affect and good eye contact today.  She reported that her sleep is improved but would like to have some additional sleep.  Patient is laughing and joking during the interview and is extremely pleasant.  Patient does not appear to be responding to any internal stimuli this continued having auditory hallucinations but states that they are not command hallucinations and she has no thoughts or wants to harm herself or anyone else.  Plan is for patient to possibly discharge tomorrow.  Treatment Plan Summary: Daily contact with patient to assess and evaluate symptoms and  progress in treatment and Medication management Continue Aristada 882 mg IM every 6 weeks next dose due on 06/23/2019 Continue Lexapro 20 mg p.o. daily for depression Continue trazodone 50 mg p.o. nightly for insomnia Encourage group therapy participation Continue every 15 minute safety checks Plan for discharge on 06/04/2019  Maryfrances Bunnell, FNP 06/03/2019, 3:47 PM

## 2019-06-03 NOTE — Progress Notes (Signed)
Recreation Therapy Notes   Date:06/03/2019  Time: 9:30 am  Location: Craft room  Behavioral response: Appropriate   Intervention Topic: Goals  Discussion/Intervention:  Group content on today was focused on goals. Patients described what goals are and how they define goals. Individuals expressed how they go about setting goals and reaching them. The group identified how important goals are and if they make short term goals to reach long term goals. Patients described how many goals they work on at a time and what affects them not reaching their goal. Individuals described how much time they put into planning and obtaining their goals. The group participated in the intervention "My Goal Board" and made personal goal boards to help them achieve their goal. Clinical Observations/Feedback:  Patient came to group and explained that her goal is to lose weight she expressed that three months ago she lost 25lbs and she wants to keep working on that goal.  Individual was social with peers and staff while participating in group.  Zamantha Strebel LRT/CTRS            Susette Seminara 06/03/2019 10:55 AM

## 2019-06-03 NOTE — Plan of Care (Addendum)
D: Pt denies SI/HI. Patient verbalized auditory hallucinations. Patient states the voices are not commanding.  Pt goal for today is to get well. Also to work on loosing weight. Patient rates her depression 2/10, hopelessness 0/10, anxiety 2/10. A: Pt was offered support and encouragement. Pt was given scheduled medications. Pt was encourage to attend groups. Q 15 minute checks were done for safety.  R: Pt interacts well with peers and staff. Pt is taking medication. Pt has no complaints.Pt receptive to treatment and safety maintained on unit.    Problem: Activity: Goal: Will identify at least one activity in which they can participate Outcome: Progressing   Problem: Coping: Goal: Ability to identify and develop effective coping behavior will improve Outcome: Progressing Goal: Ability to interact with others will improve Outcome: Progressing Goal: Demonstration of participation in decision-making regarding own care will improve Outcome: Progressing Goal: Ability to use eye contact when communicating with others will improve Outcome: Progressing   Problem: Health Behavior/Discharge Planning: Goal: Identification of resources available to assist in meeting health care needs will improve Outcome: Progressing   Problem: Self-Concept: Goal: Will verbalize positive feelings about self Outcome: Progressing

## 2019-06-03 NOTE — Tx Team (Signed)
Interdisciplinary Treatment and Diagnostic Plan Update  06/03/2019 Time of Session: 900am [name] MRN: [id number]  Principal Diagnosis: Schizophrenia [facility](HCC)  Secondary Diagnoses: Principal Problem:   Schizophrenia (HCC)   Current Medications:  Current Facility-Administered Medications  Medication Dose Route Frequency Provider Last Rate Last Dose  . acetaminophen (TYLENOL) tablet 650 mg  650 mg Oral Q6H PRN Cristofano, Worthy RancherPaul A, MD      . alum & mag hydroxide-simeth (MAALOX/MYLANTA) 200-200-20 MG/5ML suspension 30 mL  30 mL Oral Q4H PRN Cristofano, Worthy RancherPaul A, MD      . Melene Muller[START ON 06/23/2019] ARIPiprazole Lauroxil ER PRSY 882 mg  882 mg Oral Q6 weeks Money, [name], [place]      . atorvastatin (LIPITOR) tablet 20 mg  20 mg Oral q1800 Cristofano, Worthy RancherPaul A, MD   20 mg at 06/02/19 1635  . escitalopram (LEXAPRO) tablet 20 mg  20 mg Oral Daily Money, [name], FNP   20 mg at 06/03/19 [id number]  . fluticasone (FLONASE) 50 MCG/ACT nasal spray 2 spray  2 spray Each Nare Daily Money, [name], FNP   2 spray at 06/03/19 0829  . influenza vac split quadrivalent PF (FLUARIX) injection 0.5 mL  0.5 mL Intramuscular Tomorrow-1000 Clapacs, John T, MD      . magnesium hydroxide (MILK OF MAGNESIA) suspension 30 mL  30 mL Oral Daily PRN Cristofano, Paul A, MD      . metFORMIN (GLUCOPHAGE) tablet 1,000 mg  1,000 mg Oral BID WC Cristofano, Worthy RancherPaul A, MD   1,000 mg at 06/03/19 0828  . pantoprazole (PROTONIX) EC tablet 40 mg  40 mg Oral Daily Cristofano, Worthy RancherPaul A, MD   40 mg at 06/03/19 0828  . propranolol (INDERAL) tablet 10 mg  10 mg Oral BID Cristofano, Worthy RancherPaul A, MD   10 mg at 06/03/19 0827  . traZODone (DESYREL) tablet 50 mg  50 mg Oral QHS,MR X 1 Money, [name], FNP   50 mg at 06/02/19 2121   PTA Medications: Medications Prior to Admission  Medication Sig Dispense Refill Last Dose  . albuterol (VENTOLIN HFA) 108 (90 Base) MCG/ACT inhaler Inhale 2 puffs into the lungs 3 (three) times daily as needed for wheezing or  shortness of breath.     . ARISTADA 882 MG/3.2ML PRSY Take 882 mg by mouth every 6 (six) weeks.     Marland Kitchen. atorvastatin (LIPITOR) 20 MG tablet Take 20 mg by mouth every evening.     . budesonide (PULMICORT) 180 MCG/ACT inhaler Inhale 2 puffs into the lungs 2 (two) times daily.     Marland Kitchen. escitalopram (LEXAPRO) 20 MG tablet Take 1 tablet (20 mg total) by mouth daily. 30 tablet 0   . metFORMIN (GLUCOPHAGE) 1000 MG tablet Take 1,000 mg by mouth 2 (two) times daily with a meal.      . pantoprazole (PROTONIX) 20 MG tablet Take 20 mg by mouth daily.     . propranolol (INDERAL) 10 MG tablet Take 1 tablet (10 mg total) by mouth 2 (two) times daily. 60 tablet 0   . traZODone (DESYREL) 50 MG tablet Take 50 mg by mouth at bedtime.       Patient Stressors: Medication change or noncompliance Substance abuse  Patient Strengths: Manufacturing systems engineerCommunication skills Supportive family/friends  Treatment Modalities: Medication Management, Group therapy, Case management,  1 to 1 session with clinician, Psychoeducation, Recreational therapy.   Physician Treatment Plan for Primary Diagnosis: Schizophrenia [facility](HCC) Long Term Goal(s): Improvement in symptoms so as ready for discharge Improvement in symptoms so  as ready for discharge   Short Term Goals: Ability to identify changes in lifestyle to reduce recurrence of condition will improve Ability to verbalize feelings will improve Ability to disclose and discuss suicidal ideas Ability to demonstrate self-control will improve Ability to identify and develop effective coping behaviors will improve Ability to maintain clinical measurements within normal limits will improve Compliance with prescribed medications will improve Ability to identify changes in lifestyle to reduce recurrence of condition will improve Ability to verbalize feelings will improve Ability to disclose and discuss suicidal ideas Ability to demonstrate self-control will improve Ability to identify and develop  effective coping behaviors will improve Ability to maintain clinical measurements within normal limits will improve Compliance with prescribed medications will improve  Medication Management: Evaluate patient's response, side effects, and tolerance of medication regimen.  Therapeutic Interventions: 1 to 1 sessions, Unit Group sessions and Medication administration.  Evaluation of Outcomes: Progressing  Physician Treatment Plan for Secondary Diagnosis: Principal Problem:   Schizophrenia (HCC)  Long Term Goal(s): Improvement in symptoms so as ready for discharge Improvement in symptoms so as ready for discharge   Short Term Goals: Ability to identify changes in lifestyle to reduce recurrence of condition will improve Ability to verbalize feelings will improve Ability to disclose and discuss suicidal ideas Ability to demonstrate self-control will improve Ability to identify and develop effective coping behaviors will improve Ability to maintain clinical measurements within normal limits will improve Compliance with prescribed medications will improve Ability to identify changes in lifestyle to reduce recurrence of condition will improve Ability to verbalize feelings will improve Ability to disclose and discuss suicidal ideas Ability to demonstrate self-control will improve Ability to identify and develop effective coping behaviors will improve Ability to maintain clinical measurements within normal limits will improve Compliance with prescribed medications will improve     Medication Management: Evaluate patient's response, side effects, and tolerance of medication regimen.  Therapeutic Interventions: 1 to 1 sessions, Unit Group sessions and Medication administration.  Evaluation of Outcomes: Progressing   RN Treatment Plan for Primary Diagnosis: Schizophrenia (HCC) Long Term Goal(s): Knowledge of disease and therapeutic regimen to maintain health will improve  Short Term Goals:  Ability to remain free from injury will improve, Ability to identify and develop effective coping behaviors will improve and Compliance with prescribed medications will improve  Medication Management: RN will administer medications as ordered by provider, will assess and evaluate patient's response and provide education to patient for prescribed medication. RN will report any adverse and/or side effects to prescribing provider.  Therapeutic Interventions: 1 on 1 counseling sessions, Psychoeducation, Medication administration, Evaluate responses to treatment, Monitor vital signs and CBGs as ordered, Perform/monitor CIWA, COWS, AIMS and Fall Risk screenings as ordered, Perform wound care treatments as ordered.  Evaluation of Outcomes: Progressing   LCSW Treatment Plan for Primary Diagnosis: Schizophrenia (HCC) Long Term Goal(s): Safe transition to appropriate next level of care at discharge, Engage patient in therapeutic group addressing interpersonal concerns.  Short Term Goals: Engage patient in aftercare planning with referrals and resources, Increase ability to appropriately verbalize feelings, Increase emotional regulation and Increase skills for wellness and recovery  Therapeutic Interventions: Assess for all discharge needs, 1 to 1 time with Social worker, Explore available resources and support systems, Assess for adequacy in community support network, Educate family and significant other(s) on suicide prevention, Complete Psychosocial Assessment, Interpersonal group therapy.  Evaluation of Outcomes: Progressing   Progress in Treatment: Attending groups: Yes. Participating in groups: Yes. Taking medication as  prescribed: Yes. Toleration medication: Yes. Family/Significant other contact made: Yes, individual(s) contacted:  pts sister Patient understands diagnosis: No. Discussing patient identified problems/goals with staff: Yes. Medical problems stabilized or resolved: Yes. Denies  suicidal/homicidal ideation: Yes. Issues/concerns per patient self-inventory: No. Other: N/A  New problem(s) identified: No, Describe:  none  New Short Term/Long Term Goal(s): Detox, elimination of AVH/symptoms of psychosis, medication management for mood stabilization; elimination of SI thoughts; development of comprehensive mental wellness/sobriety plan.   Patient Goals:    Discharge Plan or Barriers: SPE pamphlet, Mobile Crisis information, and AA/NA information provided to patient for additional community support and resources. Pt has an appointment scheduled with [facility] on 06/08/19 at 10am.  Reason for Continuation of Hospitalization: Hallucinations Medication stabilization  Estimated Length of Stay: 2-3 days  Attendees: Patient: Nicole Cuevas 06/03/2019 10:36 AM  Physician: Dr Weber Cooks MD 06/03/2019 10:36 AM  Nursing: Polly Cobia RN 06/03/2019 10:36 AM  Case Manager: Rockne Menghini 06/03/2019 10:36 AM  Social Worker: Minette Brine Breslin Burklow LCSW 06/03/2019 10:36 AM  Recreational Therapist: Roanna Epley CTRS LRT 06/03/2019 10:36 AM  Other: Sanjuana Kava LCSW  06/03/2019 10:36 AM  Other: Assunta Curtis LCSW 06/03/2019 10:36 AM  Other: 06/03/2019 10:36 AM    Scribe for Treatment Team: Mariann Laster Zamyra Allensworth, LCSW 06/03/2019 10:36 AM

## 2019-06-03 NOTE — Progress Notes (Signed)
Recreation Therapy Notes  INPATIENT RECREATION THERAPY ASSESSMENT  Patient Details Name: Nicole Cuevas MRN: [id number] DOB: [birth year] Today's Date: 06/03/2019       Information Obtained From: Patient  Able to Participate in Assessment/Interview: Yes  Patient Presentation: Responsive  Reason for Admission (Per Patient): Active Symptoms  Patient Stressors:    Coping Skills:   TV, Music, Talk, Prayer  Leisure Interests (2+):  Exercise - Walking, Music - Listen, Sports - Basketball  Frequency of Recreation/Participation:    Awareness of Community Resources:     [employer]:     Current Use:    If no, Barriers?:    Expressed Interest in [employer] Information:    [employer] of Residence:  [employer]  Patient Main Form of Transportation: Other (Comment)  Patient Strengths:  Talking  Patient Identified Areas of Improvement:  losing weight  Patient Goal for Hospitalization:  Get more sleep and  get better  Current SI (including self-harm):  No  Current HI:  No  Current AVH: No  Staff Intervention Plan: Group Attendance, Collaborate with Interdisciplinary Treatment Team  Consent to Intern Participation: N/A  Job Holtsclaw 06/03/2019, 3:13 PM

## 2019-06-03 NOTE — Plan of Care (Signed)
Pt isolative this shift, came out for her medications and snack and went back to bed, Pt denies hearing voices. Will continues q 15 minute safety checks.   Problem: Coping: Goal: Ability to identify and develop effective coping behavior will improve Outcome: Progressing   Problem: Coping: Goal: Ability to interact with others will improve Outcome: Not Progressing

## 2019-06-04 ENCOUNTER — Other Ambulatory Visit: Payer: Self-pay

## 2019-06-04 MED ORDER — TRAZODONE HCL 50 MG PO TABS: 50.0000 mg | ORAL_TABLET | Freq: Every evening | ORAL | 1 refills | Status: AC | PRN

## 2019-06-04 MED ORDER — METFORMIN HCL 1000 MG PO TABS: 1000.0000 mg | ORAL_TABLET | Freq: Two times a day (BID) | ORAL | 1 refills | Status: AC

## 2019-06-04 MED ORDER — PROPRANOLOL HCL 10 MG PO TABS: 10.0000 mg | ORAL_TABLET | Freq: Two times a day (BID) | ORAL | 1 refills | Status: AC

## 2019-06-04 MED ORDER — PANTOPRAZOLE SODIUM 40 MG PO TBEC: 40.0000 mg | DELAYED_RELEASE_TABLET | Freq: Every day | ORAL | 1 refills | Status: AC

## 2019-06-04 MED ORDER — ESCITALOPRAM OXALATE 20 MG PO TABS: 20.0000 mg | ORAL_TABLET | Freq: Every day | ORAL | 1 refills | Status: AC

## 2019-06-04 MED ORDER — ATORVASTATIN CALCIUM 20 MG PO TABS: 20.0000 mg | ORAL_TABLET | Freq: Every day | ORAL | 1 refills | Status: AC

## 2019-06-04 NOTE — Plan of Care (Signed)
Pt rates depression and anxiety 1/10. Pt denies SI and HI. Pt admits to AVH "a little". Pt was educated on care plan and verbalizes understanding. Collier Bullock RN Problem: Activity: Goal: Will identify at least one activity in which they can participate Outcome: Adequate for Discharge   Problem: Coping: Goal: Ability to identify and develop effective coping behavior will improve Outcome: Adequate for Discharge Goal: Ability to interact with others will improve Outcome: Adequate for Discharge Goal: Demonstration of participation in decision-making regarding own care will improve Outcome: Adequate for Discharge Goal: Ability to use eye contact when communicating with others will improve Outcome: Adequate for Discharge   Problem: Health Behavior/Discharge Planning: Goal: Identification of resources available to assist in meeting health care needs will improve Outcome: Adequate for Discharge   Problem: Self-Concept: Goal: Will verbalize positive feelings about self Outcome: Adequate for Discharge

## 2019-06-04 NOTE — Plan of Care (Signed)
  Problem: Coping Skills Goal: STG - Patient will identify 3 positive coping skills strategies to use post d/c within 5 recreation therapy group sessions Description: STG - Patient will identify 3 positive coping skills strategies to use post d/c within 5 recreation therapy group sessions Outcome: Completed/Met

## 2019-06-04 NOTE — Discharge Summary (Signed)
Physician Discharge Summary Note  Patient:  Nicole Cuevas is an 58 y.o., female MRN:  [id number] DOB:  1960/11/11 Patient phone:  [phone] (home)  Patient address:   [place] [address],  Total Time spent with patient: 30 minutes  Date of Admission:  06/01/2019 Date of Discharge: June 04, 2019  Reason for Admission: Admitted after presentation to the hospital with complaints of worsening auditory hallucinations  Principal Problem: Schizophrenia [facility]) Discharge Diagnoses: Principal Problem:   Schizophrenia [facility])   Past Psychiatric History: Long history of schizophrenia and intermittent presentations for worsening symptoms generally good functioning outside the hospital when followed up as usual on current medicine  Past Medical History:  Past Medical History:  Diagnosis Date  . Pre-diabetes   . Schizophrenia [facility])     Past Surgical History:  Procedure Laterality Date  . schizophrenia     Family History: No family history on file. Family Psychiatric  History: See previous Social History:  Social History   Substance and Sexual Activity  Alcohol Use Yes  . Alcohol/week: 1.0 standard drinks  . Types: 1 Cans of beer per week     Social History   Substance and Sexual Activity  Drug Use Yes  . Types: Cocaine    Social History   Socioeconomic History  . Marital status: Single    Spouse name: Not on file  . Number of children: Not on file  . Years of education: Not on file  . Highest education level: Not on file  Occupational History  . Not on file  Social Needs  . Financial resource strain: Not on file  . Food insecurity    Worry: Not on file    Inability: Not on file  . Transportation needs    Medical: Not on file    Non-medical: Not on file  Tobacco Use  . Smoking status: Current Every Day Smoker    Packs/day: 1.00    Years: 40.00    Pack years: 40.00  . Smokeless tobacco: Never Used  Substance and Sexual Activity  .  Alcohol use: Yes    Alcohol/week: 1.0 standard drinks    Types: 1 Cans of beer per week  . Drug use: Yes    Types: Cocaine  . Sexual activity: Not on file  Lifestyle  . Physical activity    Days per week: Not on file    Minutes per session: Not on file  . Stress: Not on file  Relationships  . Social Herbalist on phone: Not on file    Gets together: Not on file    Attends religious service: Not on file    Active member of club or organization: Not on file    Attends meetings of clubs or organizations: Not on file    Relationship status: Not on file  Other Topics Concern  . Not on file  Social History Narrative  . Not on file    Hospital Course: Patient admitted to psychiatric unit.  15-minute checks maintained.  Patient showed no dangerous aggressive or suicidal behavior.  She was continued on her outpatient medicine.  No new antipsychotics were added.  She was up-to-date on her long-acting 23-month shot of aripiprazole.  Continued her Lexapro and trazodone.  Patient spontaneously resolved her auditory hallucinations.  Spontaneously resolved anxiety.  Slept well.  Has reported that hallucinations are gone mood is good and that she is generally feeling positive.  Feels like she is back to her normal  self.  Patient can be discharged back to live with her sister who is agreeable to the plan and continue follow-up as usual.  Physical Findings: AIMS:  , ,  ,  ,    CIWA:    COWS:     Musculoskeletal: Strength & Muscle Tone: within normal limits Gait & Station: normal Patient leans: N/A  Psychiatric Specialty Exam: Physical Exam  Nursing note and vitals reviewed. Constitutional: She appears well-developed and well-nourished.  HENT:  Head: Normocephalic and atraumatic.  Eyes: Pupils are equal, round, and reactive to light. Conjunctivae are normal.  Neck: Normal range of motion.  Cardiovascular: Regular rhythm and normal heart sounds.  Respiratory: Effort normal. No  respiratory distress.  GI: Soft.  Musculoskeletal: Normal range of motion.  Neurological: She is alert.  Skin: Skin is warm and dry.  Psychiatric: She has a normal mood and affect. Her behavior is normal. Judgment and thought content normal.    ROS  Blood pressure 117/74, pulse 73, temperature (!) 97.5 F (36.4 C), temperature source Oral, resp. rate 18, SpO2 96 %.There is no height or weight on file to calculate BMI.  General Appearance: Casual  Eye Contact:  Good  Speech:  Clear and Coherent  Volume:  Normal  Mood:  Euthymic  Affect:  Congruent  Thought Process:  Goal Directed  Orientation:  Full (Time, Place, and Person)  Thought Content:  Logical  Suicidal Thoughts:  No  Homicidal Thoughts:  No  Memory:  Immediate;   Fair Recent;   Fair Remote;   Fair  Judgement:  Fair  Insight:  Fair  Psychomotor Activity:  Normal  Concentration:  Concentration: Fair  Recall:  Fair  Fund of Knowledge:  Fair  Language:  Fair  Akathisia:  No  Handed:  Right  AIMS (if indicated):     Assets:  Desire for Improvement Housing Physical Health  ADL's:  Intact  Cognition:  WNL  Sleep:  Number of Hours: 7.25     Have you used any form of tobacco in the last 30 days? (Cigarettes, Smokeless Tobacco, Cigars, and/or Pipes): Yes  Has this patient used any form of tobacco in the last 30 days? (Cigarettes, Smokeless Tobacco, Cigars, and/or Pipes) Yes, Yes, A prescription for an FDA-approved tobacco cessation medication was offered at discharge and the patient refused  Blood Alcohol level:  Lab Results  Component Value Date   [facility] ServicesETH <10 05/31/2019   ETH <10 07/01/2018    Metabolic Disorder Labs:  Lab Results  Component Value Date   HGBA1C 6.3 (H) 07/01/2018   MPG 134.11 07/01/2018   No results found for: PROLACTIN Lab Results  Component Value Date   CHOL 139 06/02/2019   TRIG 70 06/02/2019   HDL 57 06/02/2019   CHOLHDL 2.4 06/02/2019   VLDL 14 06/02/2019   LDLCALC 68 06/02/2019    LDLCALC 97 07/01/2018    See Psychiatric Specialty Exam and Suicide Risk Assessment completed by Attending Physician prior to discharge.  Discharge destination:  Home  Is patient on multiple antipsychotic therapies at discharge:  No   Has Patient had three or more failed trials of antipsychotic monotherapy by history:  No  Recommended Plan for Multiple Antipsychotic Therapies: NA  Discharge Instructions    Diet - low sodium heart healthy   Complete by: As directed    Increase activity slowly   Complete by: As directed      Allergies as of 06/04/2019   No Known Allergies     Medication  List    TAKE these medications     Indication  albuterol 108 (90 Base) MCG/ACT inhaler Commonly known as: VENTOLIN HFA Inhale 2 puffs into the lungs 3 (three) times daily as needed for wheezing or shortness of breath.  Indication: Asthma   [place] 882 MG/3.2ML Prsy Generic drug: ARIPiprazole Lauroxil ER Take 882 mg by mouth every 6 (six) weeks.  Indication: Schizophrenia   atorvastatin 20 MG tablet Commonly known as: LIPITOR Take 1 tablet (20 mg total) by mouth daily at 6 PM. What changed: when to take this  Indication: High Amount of Fats in the Blood   budesonide 180 MCG/ACT inhaler Commonly known as: PULMICORT Inhale 2 puffs into the lungs 2 (two) times daily.  Indication: Asthma   escitalopram 20 MG tablet Commonly known as: LEXAPRO Take 1 tablet (20 mg total) by mouth daily.  Indication: Major Depressive Disorder   metFORMIN 1000 MG tablet Commonly known as: GLUCOPHAGE Take 1 tablet (1,000 mg total) by mouth 2 (two) times daily with a meal.  Indication: Type 2 Diabetes   pantoprazole 40 MG tablet Commonly known as: PROTONIX Take 1 tablet (40 mg total) by mouth daily. Start taking on: June 05, 2019 What changed:   medication strength  how much to take  Indication: Gastroesophageal Reflux Disease   propranolol 10 MG tablet Commonly known as: INDERAL Take 1  tablet (10 mg total) by mouth 2 (two) times daily.  Indication: Rapid Heart Rate Disorder   traZODone 50 MG tablet Commonly known as: DESYREL Take 1 tablet (50 mg total) by mouth at bedtime and may repeat dose one time if needed. What changed: when to take this  Indication: Trouble Sleeping      Follow-up Information    [facility] Follow up on 06/08/2019.   Why: You have a medication management appointment scheduled with Dr. Betsy Pries on 06/08/19 at 10AM. Thank You! Contact information: [address] [place], [place] [address] Phone: [phone] Fax: [phone]          Follow-up recommendations:  Activity:  Activity as tolerated Diet:  Regular diet Other:  Follow-up with outpatient treatment as usual with outpatient mental health services.  Comments: Patient given prescriptions at discharge  Signed: Mordecai Rasmussen, MD 06/04/2019, 10:21 AM

## 2019-06-04 NOTE — Progress Notes (Signed)
Patient alert and oriented x 4, affect is flat but she brightens upon approach, she is interacting appropriately with peers and staff, compliant with medication regimen, she denies SI/HI/AVH no bizarre behavior noted . 15 months safety checks maintained will continue to monitor.

## 2019-06-04 NOTE — Progress Notes (Signed)
  [facility] Adult Case Management Discharge Plan :  Will you be returning to the same living situation after discharge:  Yes,  pt lives with sister At discharge, do you have transportation home?: Yes,  pt will be provided with taxi voucher Do you have the ability to pay for your medications: Yes,  Medicare  Release of information consent forms completed and in the chart;  Patient's signature needed at discharge.  Patient to Follow up at: Follow-up [facility] Follow up on 06/08/2019.   Why: You have a medication management appointment scheduled with Dr. Lannette Donath on 06/08/19 at [facility]. Thank You! Contact information: [address] [place], [place] [address] Phone: [phone] Fax: [phone]          Next level of care provider has access to [place] and Suicide Prevention discussed: Yes,  Tatyana Biber, sister  Have you used any form of tobacco in the last 30 days? (Cigarettes, Smokeless Tobacco, Cigars, and/or Pipes): Yes  Has patient been referred to the Quitline?: Patient refused referral  Patient has been referred for addiction treatment: N/A  Yvette Rack, LCSW 06/04/2019, 9:00 AM

## 2019-06-04 NOTE — BHH Group Notes (Signed)
LCSW Group Therapy Note  06/04/2019 1:53 PM  Type of Therapy and Topic:  Group Therapy:  Feelings around Relapse and Recovery  Participation Level:  Did Not Attend   Description of Group:    Patients in this group will discuss emotions they experience before and after a relapse. They will process how experiencing these feelings, or avoidance of experiencing them, relates to having a relapse. Facilitator will guide patients to explore emotions they have related to recovery. Patients will be encouraged to process which emotions are more powerful. They will be guided to discuss the emotional reaction significant others in their lives may have to their relapse or recovery. Patients will be assisted in exploring ways to respond to the emotions of others without this contributing to a relapse.  Therapeutic Goals: 1. Patient will identify two or more emotions that lead to a relapse for them 2. Patient will identify two emotions that result when they relapse 3. Patient will identify two emotions related to recovery 4. Patient will demonstrate ability to communicate their needs through discussion and/or role plays   Summary of Patient Progress: x    Therapeutic Modalities:   Cognitive Behavioral Therapy Solution-Focused Therapy Assertiveness Training Relapse Prevention Therapy   Evalina Field, MSW, LCSW Clinical Social Work 06/04/2019 1:53 PM

## 2019-06-04 NOTE — Progress Notes (Signed)
Recreation Therapy Notes  INPATIENT RECREATION THERAPY ASSESSMENT  Patient Details Name: Nicole Cuevas MRN: [id number] DOB: 09/24/1960 Today's Date: 06/04/2019       Information Obtained From: Patient  Able to Participate in Assessment/Interview: Yes  Patient Presentation: Responsive  Reason for Admission (Per Patient): Active Symptoms  Patient Stressors:    Coping Skills:   TV, Music, Talk, Prayer  Leisure Interests (2+):  Exercise - Walking, Music - Listen, Sports - Basketball  Frequency of Recreation/Participation:    Awareness of Community Resources:     [employer]:     Current Use:    If no, Barriers?:    Expressed Interest in [employer] Information:    [employer] of Residence:  [employer]  Patient Main Form of Transportation: Other (Comment)  Patient Strengths:  Talking  Patient Identified Areas of Improvement:  losing weight  Patient Goal for Hospitalization:  Get more sleep and  get better  Current SI (including self-harm):  No  Current HI:  No  Current AVH: No  Staff Intervention Plan: Group Attendance, Collaborate with Interdisciplinary Treatment Team  Consent to Intern Participation: N/A  [name] 06/04/2019, 12:54 PM

## 2019-06-04 NOTE — Progress Notes (Signed)
Pt was educated on dc plan and verbalized understanding. Pt denies SI, HI and AVH. Pt received dc instructions, belongings and prescriptions. Collier Bullock RN

## 2019-06-04 NOTE — BHH Suicide Risk Assessment (Signed)
[facility] Discharge Suicide Risk Assessment   Principal Problem: Schizophrenia [facility]) Discharge Diagnoses: Principal Problem:   Schizophrenia ([place])   Total Time spent with patient: 30 minutes  Musculoskeletal: Strength & Muscle Tone: within normal limits Gait & Station: normal Patient leans: N/A  Psychiatric Specialty Exam: Review of Systems  Constitutional: Negative.   HENT: Negative.   Eyes: Negative.   Respiratory: Negative.   Cardiovascular: Negative.   Gastrointestinal: Negative.   Musculoskeletal: Negative.   Skin: Negative.   Neurological: Negative.   Psychiatric/Behavioral: Negative.     Blood pressure 117/74, pulse 73, temperature (!) 97.5 F (36.4 C), temperature source Oral, resp. rate 18, SpO2 96 %.There is no height or weight on file to calculate BMI.  General Appearance: Casual  Eye Contact::  Good  Speech:  Clear and [id number]  Volume:  Normal  Mood:  Euthymic  Affect:  Congruent  Thought Process:  Goal Directed  Orientation:  Full (Time, Place, and Person)  Thought Content:  Logical  Suicidal Thoughts:  No  Homicidal Thoughts:  No  Memory:  Immediate;   Fair Recent;   Fair Remote;   Fair  Judgement:  Fair  Insight:  Fair  Psychomotor Activity:  Normal  Concentration:  Fair  Recall:  [employer] [place]  Language: Fair  Akathisia:  No  Handed:  Right  AIMS (if indicated):     Assets:  Desire for Improvement Housing Physical Health Social Support  Sleep:  Number of Hours: 7.25  Cognition: Impaired,  Mild  ADL's:  Intact   Mental Status Per Nursing Assessment::   On Admission:     Demographic Factors:  NA  Loss Factors: NA  Historical Factors: NA  Risk Reduction Factors:   Living with another person, especially a relative, Positive social support and Positive therapeutic relationship  Continued Clinical Symptoms:  Schizophrenia:   Paranoid or undifferentiated type  Cognitive Features That Contribute To Risk:  None     Suicide Risk:  Minimal: No identifiable suicidal ideation.  Patients presenting with no risk factors but with morbid ruminations; may be classified as minimal risk based on the severity of the depressive symptoms  Follow-up [place] [facility] Follow up on 06/08/2019.   Why: You have a medication management appointment scheduled with Dr. Lannette Donath on 06/08/19 at [facility]. Thank You! Contact information: [address] [place], [place] [address] Phone: [phone] Fax: [phone]          Plan Of Care/Follow-up recommendations:  Activity:  Activity as tolerated Diet:  Diet as previously enjoyed outside the hospital Other:  Follow-up with outpatient mental health treatment and current medicine as prescribed  Alethia Berthold, MD 06/04/2019, 10:16 AM

## 2019-06-04 NOTE — Progress Notes (Signed)
Recreation Therapy Notes  Date:06/04/2019  Time: 9:30 am  Location: Craft room  Behavioral response: Appropriate   Intervention Topic: Communication  Discussion/Intervention:  Group content today was focused on communication. The group defined communication and ways to communicate with others. Individuals stated reason why communication is important and some reasons to communicate with others. Patients expressed if they thought they were good at communicating with others and ways they could improve their communication skills. The group identified important parts of communication and some experiences they have had in the past with communication. The group participated in the intervention "Words in a Bag", where they had a chance to test out their communication skills and identify ways to improve their communication techniques.  Clinical Observations/Feedback:  Patient came to group and defined communication as getting together as a group. Individual was social with peers and staff while participating in group.  Stefanee Mckell LRT/CTRS         Devere Brem 06/04/2019 11:27 AM

## 2019-12-08 IMAGING — CT CT HEAD W/O CM
3 of 4 series · 16 of 47 positions shown, 19 images · non-contrast
Comparison: None.

CLINICAL DATA: 57-year-old female with worsening paranoia. History
of schizophrenia. Initial encounter.

EXAM:
CT HEAD WITHOUT CONTRAST
TECHNIQUE: Contiguous axial images were obtained from the base of the skull
through the vertex without intravenous contrast.

[Series 3: head wo · axial · 0.41mm/px · z∈[+167,+287]mm · 10 of 29 slices shown, 13 images]
[im 3/29  brain]
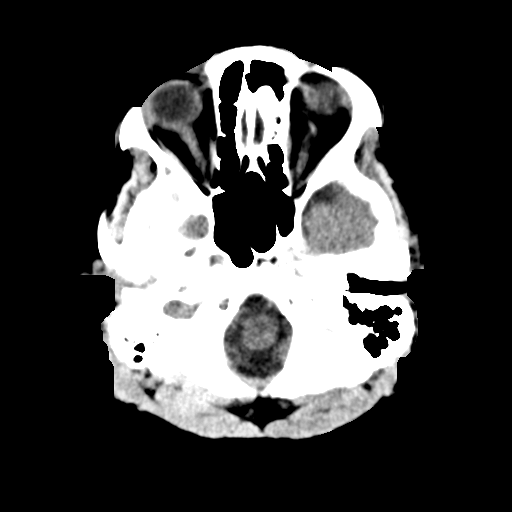
[im 3/29  bone]
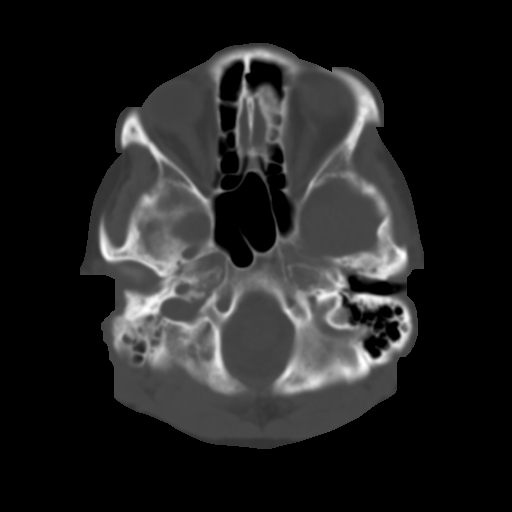
[im 5/29  brain]
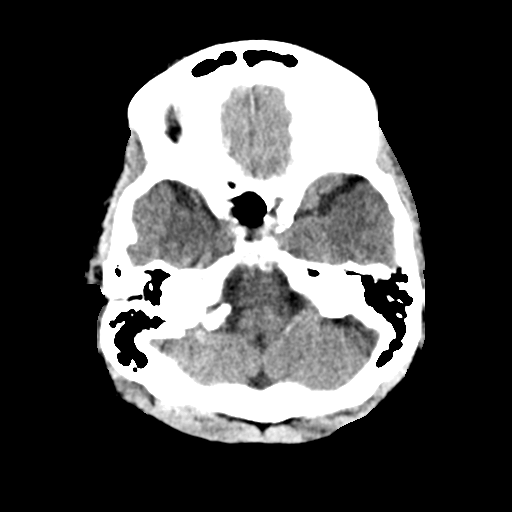
[im 9/29  brain]
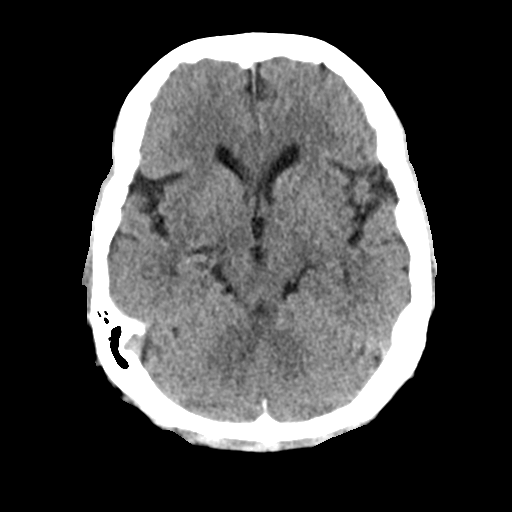
[im 11/29  brain]
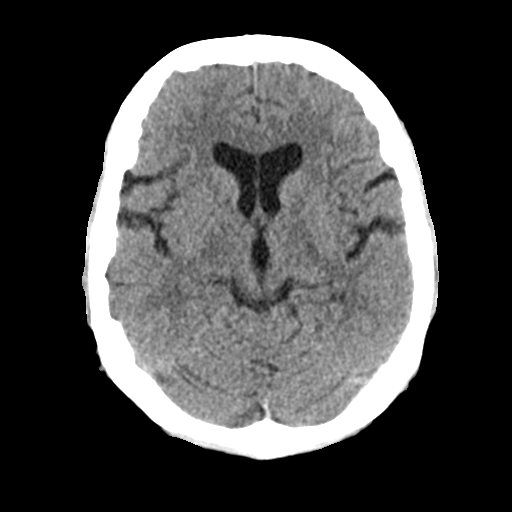
[im 13/29  brain]
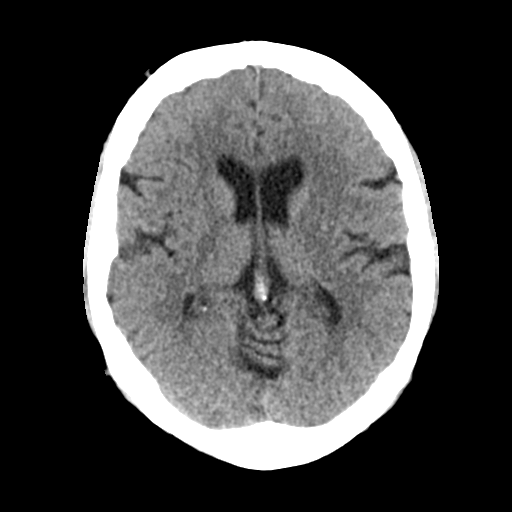
[im 13/29  bone]
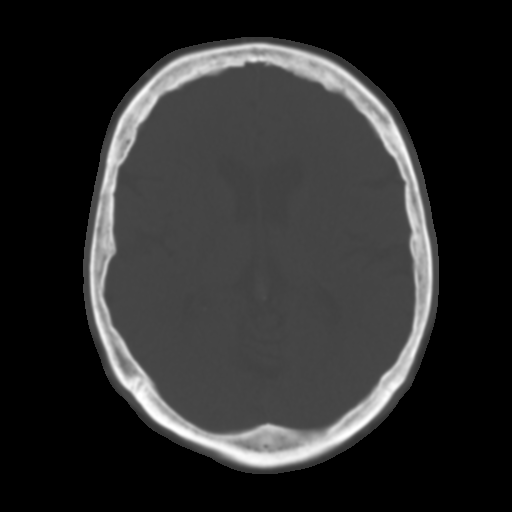
[im 17/29  brain]
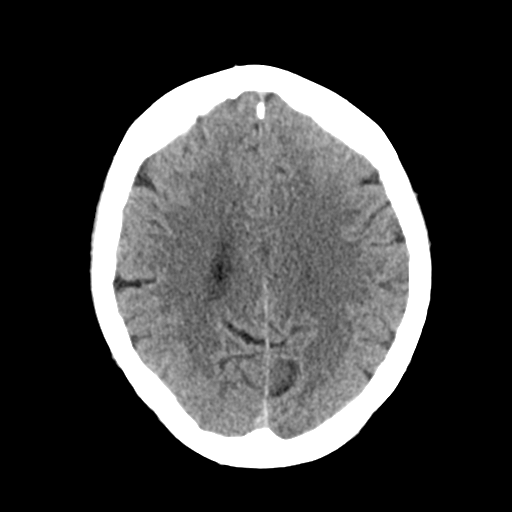
[im 19/29  brain]
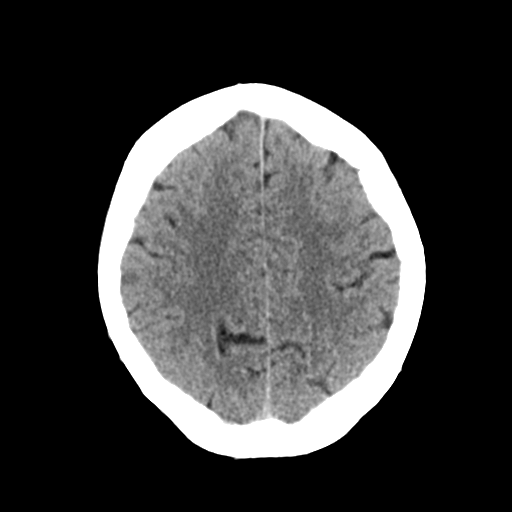
[im 21/29  brain]
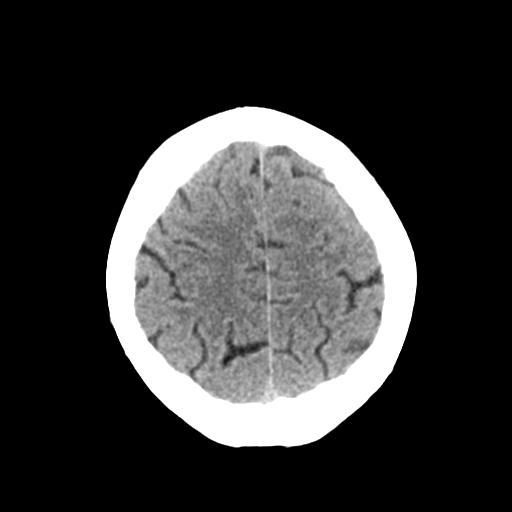
[im 25/29  brain]
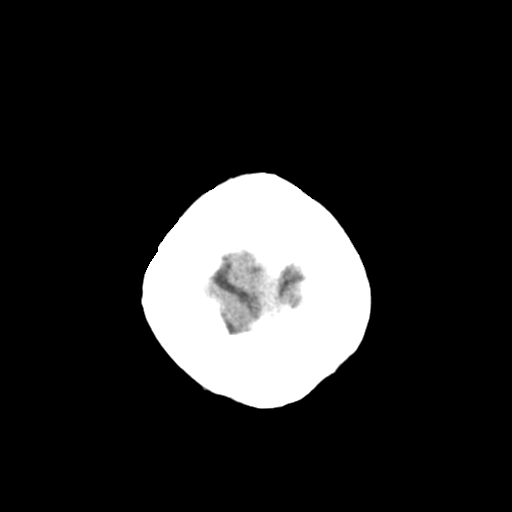
[im 25/29  bone]
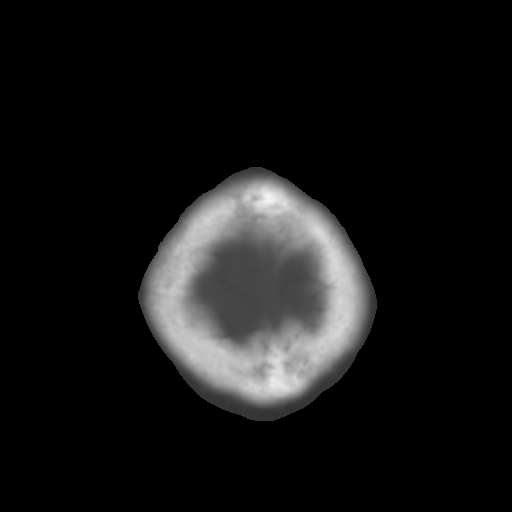
[im 27/29  brain]
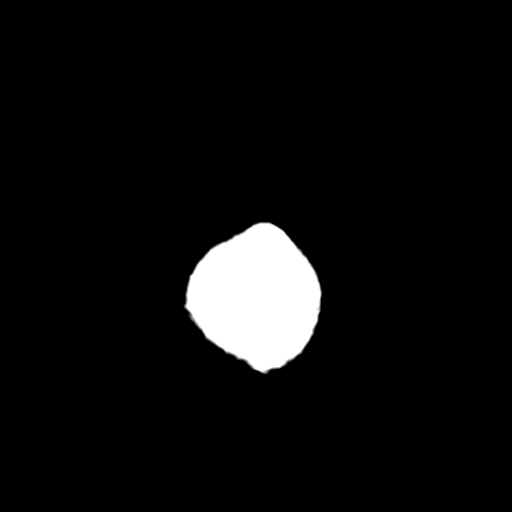

[Series 5: coronal soft tissue · coronal · 0.32mm/px · 3 of 59 slices shown]
[im 20/59  brain]
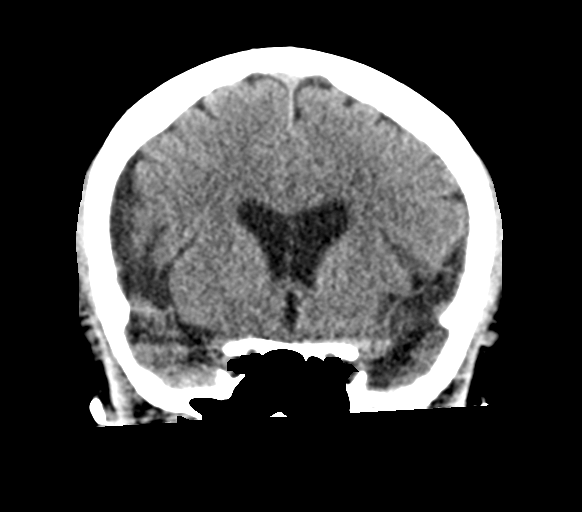
[im 26/59  brain]
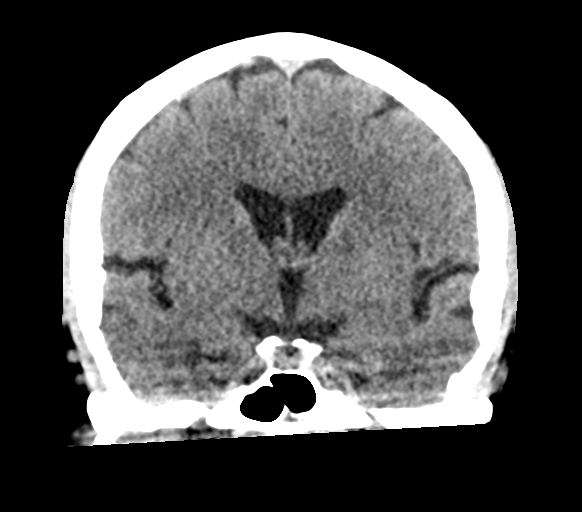
[im 33/59  brain]
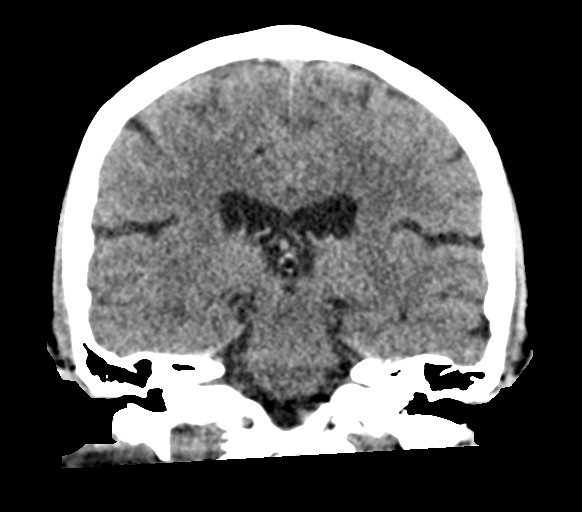

[Series 6: sagittal soft tissue · sagittal · 0.32mm/px · 3 of 51 slices shown]
[im 17/51  brain]
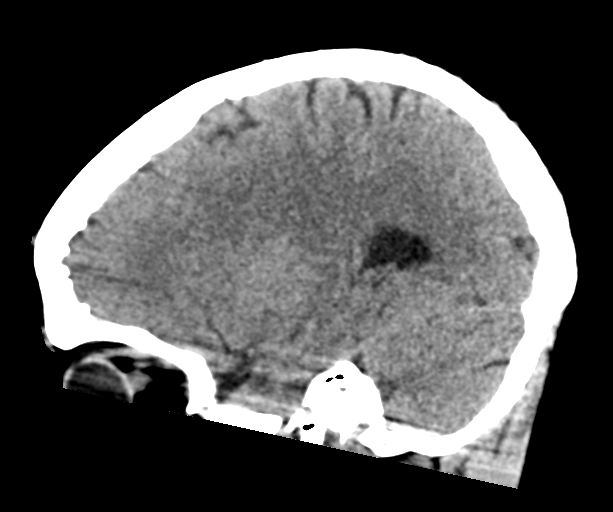
[im 26/51  brain]
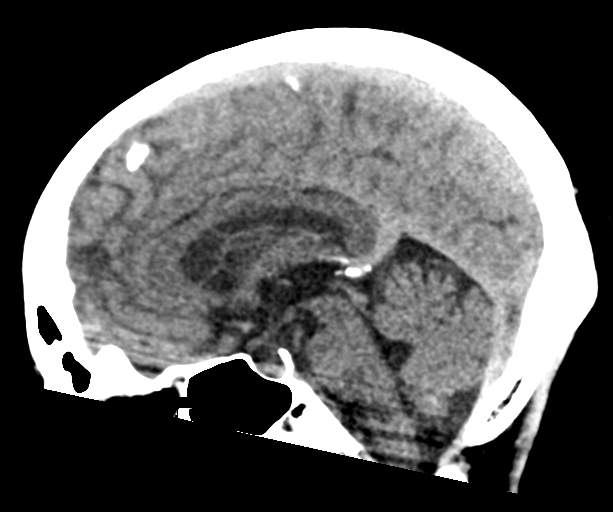
[im 34/51  brain]
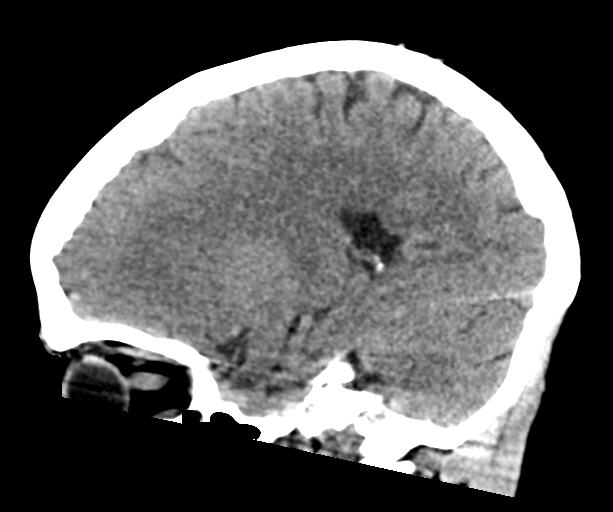

[16 of 47 positions shown; findings below may reference images not displayed]

FINDINGS: Brain: No intracranial hemorrhage or CT evidence of large acute
infarct. No intracranial mass lesion noted on this unenhanced exam.
Mild atrophy.

Vascular: No hyperdense vessel.

Skull: No acute abnormality.

Sinuses/Orbits: Visualized orbital structures without acute
abnormality. Visualized paranasal sinuses clear.

Other: Mastoid air cells and middle ear cavities are clear.
IMPRESSION: No acute intracranial abnormality noted as discussed above.
